# Patient Record
Sex: Female | Born: 2000 | Race: White | Hispanic: No | Marital: Single | Smoking: Never smoker
Health system: Southern US, Community
[De-identification: ages and names within clinical notes are randomized; demographics above are authoritative.]

## PROBLEM LIST (undated history)

## (undated) HISTORY — PX: ADENOIDECTOMY: SUR15

---
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
## 2018-08-28 ENCOUNTER — Other Ambulatory Visit: Payer: Self-pay | Admitting: Obstetrics and Gynecology

## 2018-08-29 ENCOUNTER — Telehealth: Payer: Self-pay

## 2018-08-29 DIAGNOSIS — Z3041 Encounter for surveillance of contraceptive pills: Secondary | ICD-10-CM

## 2018-08-29 MED ORDER — NORGESTIMATE-ETH ESTRADIOL 0.25-35 MG-MCG PO TABS: 1.0000 | ORAL_TABLET | Freq: Every day | ORAL | 0 refills | Status: DC

## 2018-08-29 NOTE — Telephone Encounter (Signed)
Pt has one refill of OCP left and wants it switched to a different pharmacy. Rx sent for one pack of her OCP with 0 refills to new pharmacy. Pt is scheduling annual appt while in office today.

## 2018-09-26 ENCOUNTER — Other Ambulatory Visit: Payer: Self-pay | Admitting: *Deleted

## 2018-09-26 DIAGNOSIS — Z3041 Encounter for surveillance of contraceptive pills: Secondary | ICD-10-CM

## 2018-09-26 MED ORDER — NORGESTIMATE-ETH ESTRADIOL 0.25-35 MG-MCG PO TABS: 1.0000 | ORAL_TABLET | Freq: Every day | ORAL | 0 refills | Status: DC

## 2018-09-26 NOTE — Telephone Encounter (Signed)
Pt called requesting 1 RF on OCP's.  She has an annual scheduled for 09/27/18.  [facility]

## 2018-09-27 ENCOUNTER — Encounter: Payer: Self-pay | Admitting: Family Medicine

## 2018-09-27 ENCOUNTER — Ambulatory Visit: Payer: Self-pay | Admitting: Family Medicine

## 2018-09-27 VITALS — BP 111/78 | HR 98 | Ht 64.0 in | Wt 153.0 lb

## 2018-09-27 DIAGNOSIS — Z113 Encounter for screening for infections with a predominantly sexual mode of transmission: Secondary | ICD-10-CM

## 2018-09-27 DIAGNOSIS — Z01419 Encounter for gynecological examination (general) (routine) without abnormal findings: Secondary | ICD-10-CM

## 2018-09-27 DIAGNOSIS — Z3041 Encounter for surveillance of contraceptive pills: Secondary | ICD-10-CM

## 2018-09-27 MED ORDER — NORGESTIMATE-ETH ESTRADIOL 0.25-35 MG-MCG PO TABS
1.0000 | ORAL_TABLET | Freq: Every day | ORAL | 0 refills | Status: DC
Start: 2018-09-27 — End: 2019-07-30

## 2018-09-27 NOTE — Patient Instructions (Signed)
Preventive Care 18-39 Years, Female Preventive care refers to lifestyle choices and visits with your health care provider that can promote health and wellness. What does preventive care include?  A yearly physical exam. This is also called an annual well check.  Dental exams once or twice a year.  Routine eye exams. Ask your health care provider how often you should have your eyes checked.  Personal lifestyle choices, including: ? Daily care of your teeth and gums. ? Regular physical activity. ? Eating a healthy diet. ? Avoiding tobacco and drug use. ? Limiting alcohol use. ? Practicing safe sex. ? Taking vitamin and mineral supplements as recommended by your health care provider. What happens during an annual well check? The services and screenings done by your health care provider during your annual well check will depend on your age, overall health, lifestyle risk factors, and family history of disease. Counseling Your health care provider may ask you questions about your:  Alcohol use.  Tobacco use.  Drug use.  Emotional well-being.  Home and relationship well-being.  Sexual activity.  Eating habits.  Work and work Statistician.  Method of birth control.  Menstrual cycle.  Pregnancy history.  Screening You may have the following tests or measurements:  Height, weight, and BMI.  Diabetes screening. This is done by checking your blood sugar (glucose) after you have not eaten for a while (fasting).  Blood pressure.  Lipid and cholesterol levels. These may be checked every 5 years starting at age 19.  Skin check.  Hepatitis C blood test.  Hepatitis B blood test.  Sexually transmitted disease (STD) testing.  BRCA-related cancer screening. This may be done if you have a family history of breast, ovarian, tubal, or peritoneal cancers.  Pelvic exam and Pap test. This may be done every 3 years starting at age 19. Starting at age 30, this may be done  every 5 years if you have a Pap test in combination with an HPV test.  Discuss your test results, treatment options, and if necessary, the need for more tests with your health care provider. Vaccines Your health care provider may recommend certain vaccines, such as:  Influenza vaccine. This is recommended every year.  Tetanus, diphtheria, and acellular pertussis (Tdap, Td) vaccine. You may need a Td booster every 10 years.  Varicella vaccine. You may need this if you have not been vaccinated.  HPV vaccine. If you are 19 or younger, you may need three doses over 6 months.  Measles, mumps, and rubella (MMR) vaccine. You may need at least one dose of MMR. You may also need a second dose.  Pneumococcal 13-valent conjugate (PCV13) vaccine. You may need this if you have certain conditions and were not previously vaccinated.  Pneumococcal polysaccharide (PPSV23) vaccine. You may need one or two doses if you smoke cigarettes or if you have certain conditions.  Meningococcal vaccine. One dose is recommended if you are age 68-19 years and a first-year college student living in a residence hall, or if you have one of several medical conditions. You may also need additional booster doses.  Hepatitis A vaccine. You may need this if you have certain conditions or if you travel or work in places where you may be exposed to hepatitis A.  Hepatitis B vaccine. You may need this if you have certain conditions or if you travel or work in places where you may be exposed to hepatitis B.  Haemophilus influenzae type b (Hib) vaccine. You may need this  if you have certain risk factors.  Talk to your health care provider about which screenings and vaccines you need and how often you need them. This information is not intended to replace advice given to you by your health care provider. Make sure you discuss any questions you have with your health care provider. Document Released: 12/20/2001 Document Revised:  07/13/2016 Document Reviewed: 08/25/2015 Elsevier Interactive Patient Education  2018 Elsevier Inc.  

## 2018-09-27 NOTE — Progress Notes (Signed)
  Subjective:     Melissa Dorsey is a 17 y.o. female and is here for a comprehensive physical exam. The patient reports problems - mood changes. Unclear if this is related to her current OC regimen.   The following portions of the patient's history were reviewed and updated as appropriate: allergies, current medications, past family history, past medical history, past social history, past surgical history and problem list.  Review of Systems Pertinent items noted in HPI and remainder of comprehensive ROS otherwise negative.   Objective:    BP 111/78   Pulse 98   Ht 5\' 4"  (1.626 m)   Wt 153 lb (69.4 kg)   LMP 09/25/2018   BMI 26.26 kg/m  General appearance: alert, cooperative and appears stated age Head: Normocephalic, without obvious abnormality, atraumatic Neck: no adenopathy, supple, symmetrical, trachea midline and thyroid not enlarged, symmetric, no tenderness/mass/nodules Back: symmetric, no curvature. ROM normal. No CVA tenderness. Lungs: clear to auscultation bilaterally Breasts: normal appearance, no masses or tenderness Heart: regular rate and rhythm, S1, S2 normal, no murmur, click, rub or gallop Abdomen: soft, non-tender; bowel sounds normal; no masses,  no organomegaly Pelvic: cervix normal in appearance, external genitalia normal, no adnexal masses or tenderness, no cervical motion tenderness, uterus normal size, shape, and consistency and vagina normal without discharge Extremities: extremities normal, atraumatic, no cyanosis or edema Pulses: 2+ and symmetric Skin: Skin color, texture, turgor normal. No rashes or lesions or mulitple irregularly shaped moles on back and chest noted Lymph nodes: Cervical, supraclavicular, and axillary nodes normal. Neurologic: Grossly normal    Assessment:    Healthy female exam.      Plan:     Encounter for gynecological examination without abnormal finding  Encounter for surveillance of contraceptive pills - refilled OC's--may  skip placebos if mood swiings are related to this. Also, consider IUD as alternative. - Plan: norgestimate-ethinyl estradiol (ORTHO-CYCLEN,SPRINTEC,PREVIFEM) 0.25-35 MG-MCG tablet  Screen for STD (sexually transmitted disease) - Plan: Cervicovaginal ancillary only, HIV Antibody (routine testing w rflx), Hepatitis C antibody, Hepatitis B surface antigen, RPR   See After Visit Summary for Counseling Recommendations

## 2018-09-28 LAB — CERVICOVAGINAL ANCILLARY ONLY
Chlamydia: NEGATIVE
Neisseria Gonorrhea: NEGATIVE

## 2018-09-28 LAB — HEPATITIS C ANTIBODY
HEP C AB: NONREACTIVE
SIGNAL TO CUT-OFF: 0.02 (ref <1.00)

## 2018-09-28 LAB — HEPATITIS B SURFACE ANTIGEN: HEP B S AG: NONREACTIVE

## 2018-09-28 LAB — HIV ANTIBODY (ROUTINE TESTING W REFLEX): HIV 1&2 Ab, 4th Generation: NONREACTIVE

## 2018-09-28 LAB — RPR: RPR: NONREACTIVE

## 2019-07-29 ENCOUNTER — Other Ambulatory Visit: Payer: Self-pay | Admitting: Obstetrics and Gynecology

## 2019-07-29 DIAGNOSIS — Z3041 Encounter for surveillance of contraceptive pills: Secondary | ICD-10-CM

## 2019-07-30 ENCOUNTER — Telehealth: Payer: Self-pay

## 2019-07-30 DIAGNOSIS — Z3041 Encounter for surveillance of contraceptive pills: Secondary | ICD-10-CM

## 2019-07-30 MED ORDER — NORGESTIMATE-ETH ESTRADIOL 0.25-35 MG-MCG PO TABS: 1.0000 | ORAL_TABLET | Freq: Every day | ORAL | 3 refills | Status: DC

## 2019-07-30 NOTE — Telephone Encounter (Signed)
PT needs refill of OCP until her annual in November. Refill sent.

## 2019-08-12 ENCOUNTER — Emergency Department (INDEPENDENT_AMBULATORY_CARE_PROVIDER_SITE_OTHER)
Admission: EM | Admit: 2019-08-12 | Discharge: 2019-08-12 | Disposition: A | Discharge status: Home / Self Care | Payer: Self-pay | Source: Home / Self Care | Attending: Family Medicine | Admitting: Family Medicine

## 2019-08-12 ENCOUNTER — Emergency Department (INDEPENDENT_AMBULATORY_CARE_PROVIDER_SITE_OTHER): Payer: Self-pay

## 2019-08-12 ENCOUNTER — Other Ambulatory Visit: Payer: Self-pay

## 2019-08-12 DIAGNOSIS — M940 Chondrocostal junction syndrome [Tietze]: Secondary | ICD-10-CM

## 2019-08-12 DIAGNOSIS — R0781 Pleurodynia: Secondary | ICD-10-CM

## 2019-08-12 LAB — POCT URINALYSIS DIP (MANUAL ENTRY)
Bilirubin, UA: NEGATIVE
Glucose, UA: NEGATIVE mg/dL
Ketones, POC UA: NEGATIVE mg/dL
Nitrite, UA: NEGATIVE
Protein Ur, POC: NEGATIVE mg/dL
Spec Grav, UA: 1.025 (ref 1.010–1.025)
Urobilinogen, UA: 0.2 E.U./dL
pH, UA: 6 (ref 5.0–8.0)

## 2019-08-12 MED ORDER — PREDNISONE 20 MG PO TABS: ORAL_TABLET | ORAL | 0 refills | Status: DC

## 2019-08-12 NOTE — ED Triage Notes (Signed)
Pt c/o RT sided rib pain that radiates down x 3 days. Pain worsens with breathing/coughing. Pain 8/10. Taking ibuprofen prn.

## 2019-08-12 NOTE — Discharge Instructions (Addendum)
Apply ice pack for 20 to 30 minutes, 3 to 4 times daily  Continue until pain and swelling decrease.  After finishing prednisone, may switch to Ibuprofen 200mg , 4 tabs every 8 hours with food.

## 2019-08-12 NOTE — ED Provider Notes (Signed)
[facility]    CSN: [id number] Arrival date & time: 08/12/19  1259      History   Chief Complaint Chief Complaint  Patient presents with  . Flank Pain    RT    HPI Melissa Dorsey is a 18 y.o. female.   Patient complains of 3 day history of right side rib pain that radiates downward.  The pain is worse with inspiration/coughing although she denies cough or recent URI.   She denies fevers, chills, and sweats, and no urinary symptoms.  The history is provided by the patient.  Chest Pain Pain location:  R chest Pain quality: sharp   Radiates to: right upper abdomen. Pain severity:  Moderate Onset quality:  Sudden Duration:  3 days Timing:  Intermittent Progression:  Unchanged Chronicity:  New Context: breathing   Relieved by:  Nothing Worsened by:  Coughing Ineffective treatments: Ibuprofen. Associated symptoms: no abdominal pain, no back pain, no cough, no diaphoresis, no dysphagia, no fatigue, no fever, no nausea and no shortness of breath     History reviewed. No pertinent past medical history.  There are no active problems to display for this patient.   Past Surgical History:  Procedure Laterality Date  . ADENOIDECTOMY      OB History    Gravida  0   Para  0   Term  0   Preterm  0   AB  0   Living  0     SAB  0   TAB  0   Ectopic  0   Multiple  0   Live Births  0            Home Medications    Prior to Admission medications   Medication Sig Start Date End Date Taking? Authorizing Provider  norgestimate-ethinyl estradiol (ORTHO-CYCLEN) 0.25-35 MG-MCG tablet Take 1 tablet by mouth daily. 07/30/19   Emily Filbert, MD  predniSONE (DELTASONE) 20 MG tablet Take one tab by mouth twice daily for 4 days, then one daily. Take with food. 08/12/19   Kandra Nicolas, MD    Family History Family History  Problem Relation Age of Onset  . Heart attack Paternal Grandfather   . Hypertension Father     Social History Social History    Tobacco Use  . Smoking status: Never Smoker  . Smokeless tobacco: Never Used  Substance Use Topics  . Alcohol use: No  . Drug use: No     Allergies   Patient has no known allergies.   Review of Systems Review of Systems  Constitutional: Negative for diaphoresis, fatigue and fever.  HENT: Negative for trouble swallowing.   Respiratory: Negative for cough and shortness of breath.   Cardiovascular: Positive for chest pain.  Gastrointestinal: Negative for abdominal pain and nausea.  Genitourinary: Negative.   Musculoskeletal: Negative for back pain.  All other systems reviewed and are negative.    Physical Exam Triage Vital Signs ED Triage Vitals  Enc Vitals Group     BP 08/12/19 1335 119/76     Pulse Rate 08/12/19 1335 88     Resp 08/12/19 1335 18     Temp 08/12/19 1335 97.9 F (36.6 C)     Temp Source 08/12/19 1335 Oral     SpO2 08/12/19 1335 99 %     Weight 08/12/19 1336 179 lb (81.2 kg)     Height 08/12/19 1336 5\' 4"  (1.626 m)     Head Circumference --  Peak Flow --      Pain Score 08/12/19 1336 8     Pain Loc --      Pain Edu? --      Excl. in GC? --    No data found.  Updated Vital Signs BP 119/76 (BP Location: Right Arm)   Pulse 88   Temp 97.9 F (36.6 C) (Oral)   Resp 18   Ht 5\' 4"  (1.626 m)   Wt 81.2 kg   LMP 07/29/2019 (Approximate)   SpO2 99%   BMI 30.73 kg/m   Visual Acuity Right Eye Distance:   Left Eye Distance:   Bilateral Distance:    Right Eye Near:   Left Eye Near:    Bilateral Near:     Physical Exam Vitals signs and nursing note reviewed.  Constitutional:      General: She is not in acute distress. HENT:     Head: Normocephalic.     Nose: Nose normal.     Mouth/Throat:     Pharynx: Oropharynx is clear.  Eyes:     Pupils: Pupils are equal, round, and reactive to light.  Cardiovascular:     Heart sounds: Normal heart sounds.  Pulmonary:     Breath sounds: Normal breath sounds.  Chest:       Comments: There is  distinct tenderness to palpation right lower anterior chest as noted on diagram.  Abdominal:     Tenderness: There is no abdominal tenderness.  Musculoskeletal:     Right lower leg: No edema.     Left lower leg: No edema.  Lymphadenopathy:     Cervical: No cervical adenopathy.  Skin:    General: Skin is warm and dry.     Findings: No rash.  Neurological:     Mental Status: She is alert.      UC Treatments / Results  Labs (all labs ordered are listed, but only abnormal results are displayed) Labs Reviewed  POCT URINALYSIS DIP (MANUAL ENTRY) - Abnormal; Notable for the following components:      Result Value   Blood, UA small (*)    Leukocytes, UA Small (1+) (*)    All other components within normal limits  URINE CULTURE    EKG   Radiology Dg Ribs Unilateral W/chest Right  Result Date: 08/12/2019 CLINICAL DATA:  Right anterior lower rib pain for several days. No known injury. EXAM: RIGHT RIBS AND CHEST - 3+ VIEW COMPARISON:  None. FINDINGS: The heart size and mediastinal contours are normal. The lungs are clear. There is no pleural effusion or pneumothorax. A metallic BB was placed over the right inferior costal margin at the area of pain. No underlying rib fracture or focal rib abnormality identified. IMPRESSION: No evidence of rib fracture, focal rib lesion or active cardiopulmonary process. Electronically Signed   By: Carey BullocksWilliam  Veazey M.D.   On: 08/12/2019 14:38    Procedures Procedures (including critical care time)  Medications Ordered in UC Medications - No data to display  Initial Impression / Assessment and Plan / UC Course  I have reviewed the triage vital signs and the nursing notes.  Pertinent labs & imaging results that were available during my care of the patient were reviewed by me and considered in my medical decision making (see chart for details).    As a precaution will check urine culture. Begin prednisone burst/taper. Followup with Dr. Rodney Langtonhomas  Thekkekandam or Dr. Clementeen GrahamEvan [facility] ([facility]) if not improving about two weeks.  Final Clinical Impressions(s) / UC Diagnoses   Final diagnoses:  Costochondritis     Discharge Instructions     Apply ice pack for 20 to 30 minutes, 3 to 4 times daily  Continue until pain and swelling decrease.  After finishing prednisone, may switch to Ibuprofen 200mg , 4 tabs every 8 hours with food.     ED Prescriptions    Medication Sig Dispense Auth. Provider   predniSONE (DELTASONE) 20 MG tablet Take one tab by mouth twice daily for 4 days, then one daily. Take with food. 11 tablet , MD        Lattie Haw, MD 08/16/19 [phone]

## 2019-08-14 ENCOUNTER — Telehealth: Payer: Self-pay | Admitting: Family Medicine

## 2019-08-14 ENCOUNTER — Telehealth: Payer: Self-pay | Admitting: *Deleted

## 2019-08-14 LAB — URINE CULTURE
MICRO NUMBER:: 955426
SPECIMEN QUALITY:: ADEQUATE

## 2019-08-14 MED ORDER — CIPROFLOXACIN HCL 500 MG PO TABS: 500.0000 mg | ORAL_TABLET | Freq: Two times a day (BID) | ORAL | 0 refills | Status: AC

## 2019-08-14 NOTE — Telephone Encounter (Signed)
Urine culture shows e. Coli, sensitive to multiple antibiotics. A.  UTI P.  Begin Cipro 500mg  BID for one week

## 2019-08-14 NOTE — Telephone Encounter (Signed)
LM to call back to inform pt of Dr Aurelio Brash phone note.

## 2019-08-14 NOTE — Telephone Encounter (Signed)
Spoke to pt given Ucx results. Charna Archer, LPN

## 2019-10-02 ENCOUNTER — Ambulatory Visit: Payer: Self-pay | Admitting: Obstetrics and Gynecology

## 2019-10-08 ENCOUNTER — Ambulatory Visit (INDEPENDENT_AMBULATORY_CARE_PROVIDER_SITE_OTHER): Payer: Self-pay | Admitting: Advanced Practice Midwife

## 2019-10-08 ENCOUNTER — Other Ambulatory Visit: Payer: Self-pay

## 2019-10-08 ENCOUNTER — Encounter: Payer: Self-pay | Admitting: Advanced Practice Midwife

## 2019-10-08 VITALS — BP 135/84 | HR 105 | Ht 64.0 in | Wt 177.0 lb

## 2019-10-08 DIAGNOSIS — A630 Anogenital (venereal) warts: Secondary | ICD-10-CM

## 2019-10-08 DIAGNOSIS — Z3041 Encounter for surveillance of contraceptive pills: Secondary | ICD-10-CM

## 2019-10-08 DIAGNOSIS — Z01419 Encounter for gynecological examination (general) (routine) without abnormal findings: Secondary | ICD-10-CM

## 2019-10-08 DIAGNOSIS — Z3009 Encounter for other general counseling and advice on contraception: Secondary | ICD-10-CM

## 2019-10-08 MED ORDER — NORGESTIMATE-ETH ESTRADIOL 0.25-35 MG-MCG PO TABS: 1.0000 | ORAL_TABLET | Freq: Every day | ORAL | 11 refills | Status: DC

## 2019-10-08 MED ORDER — IMIQUIMOD 5 % EX CREA: TOPICAL_CREAM | CUTANEOUS | 1 refills | Status: DC

## 2019-10-08 NOTE — Progress Notes (Signed)
GYNECOLOGY ANNUAL PREVENTATIVE CARE ENCOUNTER NOTE  History:     Melissa Dorsey is a 18 y.o. G0P0000 female here for a routine annual gynecologic exam and birth control refill. Current complaints: patient complains of a few small bumps on vagina for about 3 weeks. She denies pain. Reports that the bumps sometimes itch and bleed if she wipes too hard after using the bathroom. Denies abnormal vaginal bleeding, discharge, pelvic pain, problems with intercourse or other gynecologic concerns.    Gynecologic History Patient's last menstrual period was 09/24/2019. Contraception: OCP (estrogen/progesterone) Last Pap: N/A.  Last mammogram: N/A  Obstetric History OB History  Gravida Para Term Preterm AB Living  0 0 0 0 0 0  SAB TAB Ectopic Multiple Live Births  0 0 0 0 0    History reviewed. No pertinent past medical history.  Past Surgical History:  Procedure Laterality Date  . ADENOIDECTOMY      Current Outpatient Medications on File Prior to Visit  Medication Sig Dispense Refill  . norgestimate-ethinyl estradiol (ORTHO-CYCLEN) 0.25-35 MG-MCG tablet Take 1 tablet by mouth daily. 1 Package 3  . predniSONE (DELTASONE) 20 MG tablet Take one tab by mouth twice daily for 4 days, then one daily. Take with food. (Patient not taking: Reported on 10/08/2019) 11 tablet 0   No current facility-administered medications on file prior to visit.     No Known Allergies  Social History:  reports that she has never smoked. She has never used smokeless tobacco. She reports that she does not drink alcohol or use drugs.  Family History  Problem Relation Age of Onset  . Heart attack Paternal Grandfather   . Hypertension Father     The following portions of the patient's history were reviewed and updated as appropriate: allergies, current medications, past family history, past medical history, past social history, past surgical history and problem list.  Review of Systems Pertinent items noted in  HPI and remainder of comprehensive ROS otherwise negative.  Physical Exam:  BP 135/84   Pulse (!) 105   Ht 5\' 4"  (1.626 m)   Wt 80.3 kg   LMP 09/24/2019   BMI 30.38 kg/m  CONSTITUTIONAL: Well-developed, well-nourished female in no acute distress.  HENT:  Normocephalic, atraumatic, External right and left ear normal. Oropharynx is clear and moist EYES: Conjunctivae and EOM are normal. Pupils are equal, round, and reactive to light. No scleral icterus.  NECK: Normal range of motion, supple, no masses.  Normal thyroid.  SKIN: Skin is warm and dry. No rash noted. Not diaphoretic. No erythema. No pallor. MUSCULOSKELETAL: Normal range of motion. No tenderness.  No cyanosis, clubbing, or edema.  2+ distal pulses. NEUROLOGIC: Alert and oriented to person, place, and time. Normal reflexes, muscle tone coordination. No cranial nerve deficit noted. PSYCHIATRIC: Normal mood and affect. Normal behavior. Normal judgment and thought content. CARDIOVASCULAR: Normal heart rate, regular rhythm RESPIRATORY: Clear to auscultation bilaterally. Effort and breath sounds normal, no problems with respiration noted. BREASTS: Symmetric in size. No masses, skin changes, nipple drainage, or lymphadenopathy. ABDOMEN: Soft, normal bowel sounds, no distention noted.  No tenderness, rebound or guarding.  PELVIC: Normal appearing external genitalia; Various small, pink, irregular shaped bumps noted along labia with 2 large pink, irregular shaped bumps near perineum, c/w genital warts. TCA applied.    Assessment and Plan:  1. Genital warts - Pt c/o itchy bumps that bleed with wiping - Bumps consistent with genital warts - TCA applied today in the office  -  Rx for Aldara cream sent to pharmacy  - imiquimod (ALDARA) 5 % cream; Apply topically 3 (three) times a week.  Dispense: 12 each; Refill: 1  2. Well woman exam - Normal well woman exam - Follow up in 1 year for well woman exam with Pap   3. Encounter for general  counseling and advice on contraceptive management - Contraceptive management - Pt currently on Sprintec. No issues. Would like to continue current birth control method - Rx for 1 year supply given  - norgestimate-ethinyl estradiol (ORTHO-CYCLEN) 0.25-35 MG-MCG tablet; Take 1 tablet by mouth daily.  Dispense: 1 Package; Refill: 11  4. Encounter for surveillance of contraceptive pills - Birth control refilled   Follow up in 1 year for well woman exam and pap smear  Routine preventative health maintenance measures emphasized. Please refer to After Visit Summary for other counseling recommendations.    Maryagnes Amos, SNM 1:20 PM

## 2019-10-08 NOTE — Patient Instructions (Signed)
Genital Warts Genital warts are a common STD (sexually transmitted disease). They may appear as small bumps on the skin of the genital and anal areas. They sometimes become irritated and cause pain. Genital warts are easily passed to other people through sexual contact. Many people do not know that they are infected. They may be infected for years without symptoms. However, even if they do not have symptoms, they can pass the infection to their sexual partners. Getting treatment is important because genital warts can lead to other problems. In females, the virus that causes genital warts may increase the risk for cervical cancer. What are the causes? This condition is caused by a virus that is called human papillomavirus (HPV). HPV is spread by having unprotected sex with an infected person. It can be spread through vaginal, anal, and oral sex. What increases the risk? You are more likely to develop this condition if:  You have unprotected sex.  You have multiple sexual partners.  You are sexually active before age 16.  You are a man who isnot circumcised.  You have a female sexual partner who is not circumcised.  You have a weakened body defense system (immune system) due to disease or medicine.  You smoke. What are the signs or symptoms? Symptoms of this condition include:  Small growths in the genital area or anal area. These warts often grow in clusters.  Itching and irritation in the genital area or anal area.  Bleeding from the warts.  Pain during sex. How is this diagnosed? This condition is diagnosed based on:  Your symptoms.  A physical exam. You may also have other tests, including:  Biopsy. A tissue sample is removed so it can be checked under a microscope.  Colposcopy. In females, a magnifying tool is used to examine the vagina and cervix. Certain solutions may be used to make the HPV cells change color so they can be seen more easily.  A Pap test in females.   Tests for other STDs. How is this treated? This condition may be treated with:  Medicines, such as: ? Solutions or creams applied to your skin (topical).  Procedures, such as: ? Freezing the warts with liquid nitrogen (cryotherapy). ? Burning the warts with a laser or electric probe (electrocautery). ? Surgery to remove the warts. Follow these instructions at home: Medicines   Apply over-the-counter and prescription medicines only as told by your health care provider.  Do not treat genital warts with medicines that are used for treating hand warts.  Talk with your health care provider about using over-the-counter anti-itch creams. Instructions for women  Get screened regularly for cervical cancer. Women who have genital warts are at an increased risk for this cancer. This type of cancer is slow growing and can be cured if it is found early.  If you become pregnant, tell your health care provider that you have had an HPV infection. Your health care provider will monitor you closely during pregnancy to be sure that your baby is safe. General instructions  Do not touch or scratch the warts.  Do not have sex until your treatment has been completed.  Tell your current and past sexual partners about your condition because they may also need treatment.  After treatment, use condoms during sex to prevent future infections.  Keep all follow-up visits as told by your health care provider. This is important. How is this prevented? Talk with your health care provider about getting the HPV vaccine. The vaccine:    Can prevent some HPV infections and cancers.  Is recommended for males and females who are 11-26 years old.  Is not recommended for pregnant women.  Will not work if you already have HPV. Contact a health care provider if you:  Have redness, swelling, or pain in the area of the treated skin.  Have a fever.  Feel generally ill.  Feel lumps in and around your genital or  anal area.  Have bleeding in your genital or anal area.  Have pain during sex. Summary  Genital warts are a common STD (sexually transmitted disease). It may appear as small bumps on the genital and anal areas.  This condition is caused by a virus that is called human papillomavirus (HPV). HPV is spread by having unprotected sex with an infected person. It can be spread through vaginal, anal, and oral sex.  Treatment is important because genital warts can lead to other problems. In females, the virus that causes genital warts may increase the risk for cervical cancer.  This condition may be treated with medicine that is applied to the skin, or procedures to remove the warts.  The HPV vaccine can prevent some HPV infections and cancers. It is recommended that the vaccine be given to males and females who are 11-26 years old. This information is not intended to replace advice given to you by your health care provider. Make sure you discuss any questions you have with your health care provider. Document Released: 10/21/2000 Document Revised: 11/28/2017 Document Reviewed: 11/28/2017 Elsevier Patient Education  2020 [employer] Inc.  

## 2019-10-08 NOTE — Progress Notes (Signed)
No complaints Needs refill of OCP

## 2019-11-05 ENCOUNTER — Ambulatory Visit: Payer: Self-pay | Admitting: Advanced Practice Midwife

## 2019-11-18 ENCOUNTER — Ambulatory Visit: Payer: Self-pay | Admitting: Obstetrics & Gynecology

## 2019-12-02 ENCOUNTER — Encounter: Payer: Self-pay | Admitting: Obstetrics & Gynecology

## 2019-12-02 ENCOUNTER — Ambulatory Visit (INDEPENDENT_AMBULATORY_CARE_PROVIDER_SITE_OTHER): Payer: Self-pay | Admitting: Obstetrics & Gynecology

## 2019-12-02 ENCOUNTER — Other Ambulatory Visit: Payer: Self-pay

## 2019-12-02 VITALS — BP 122/79 | HR 89 | Resp 16 | Ht 64.0 in | Wt 178.0 lb

## 2019-12-02 DIAGNOSIS — A630 Anogenital (venereal) warts: Secondary | ICD-10-CM

## 2019-12-02 NOTE — Patient Instructions (Signed)
Genital Warts Genital warts are a common STD (sexually transmitted disease). They may appear as small bumps on the skin of the genital and anal areas. They sometimes become irritated and cause pain. Genital warts are easily passed to other people through sexual contact. Many people do not know that they are infected. They may be infected for years without symptoms. However, even if they do not have symptoms, they can pass the infection to their sexual partners. Getting treatment is important because genital warts can lead to other problems. In females, the virus that causes genital warts may increase the risk for cervical cancer. What are the causes? This condition is caused by a virus that is called human papillomavirus (HPV). HPV is spread by having unprotected sex with an infected person. It can be spread through vaginal, anal, and oral sex. What increases the risk? You are more likely to develop this condition if:  You have unprotected sex.  You have multiple sexual partners.  You are sexually active before age 16.  You are a man who isnot circumcised.  You have a female sexual partner who is not circumcised.  You have a weakened body defense system (immune system) due to disease or medicine.  You smoke. What are the signs or symptoms? Symptoms of this condition include:  Small growths in the genital area or anal area. These warts often grow in clusters.  Itching and irritation in the genital area or anal area.  Bleeding from the warts.  Pain during sex. How is this diagnosed? This condition is diagnosed based on:  Your symptoms.  A physical exam. You may also have other tests, including:  Biopsy. A tissue sample is removed so it can be checked under a microscope.  Colposcopy. In females, a magnifying tool is used to examine the vagina and cervix. Certain solutions may be used to make the HPV cells change color so they can be seen more easily.  A Pap test in  females.  Tests for other STDs. How is this treated? This condition may be treated with:  Medicines, such as: ? Solutions or creams applied to your skin (topical).  Procedures, such as: ? Freezing the warts with liquid nitrogen (cryotherapy). ? Burning the warts with a laser or electric probe (electrocautery). ? Surgery to remove the warts. Follow these instructions at home: Medicines   Apply over-the-counter and prescription medicines only as told by your health care provider.  Do not treat genital warts with medicines that are used for treating hand warts.  Talk with your health care provider about using over-the-counter anti-itch creams. Instructions for women  Get screened regularly for cervical cancer. Women who have genital warts are at an increased risk for this cancer. This type of cancer is slow growing and can be cured if it is found early.  If you become pregnant, tell your health care provider that you have had an HPV infection. Your health care provider will monitor you closely during pregnancy to be sure that your baby is safe. General instructions  Do not touch or scratch the warts.  Do not have sex until your treatment has been completed.  Tell your current and past sexual partners about your condition because they may also need treatment.  After treatment, use condoms during sex to prevent future infections.  Keep all follow-up visits as told by your health care provider. This is important. How is this prevented? Talk with your health care provider about getting the HPV vaccine. The vaccine:    Can prevent some HPV infections and cancers.  Is recommended for males and females who are 11-26 years old.  Is not recommended for pregnant women.  Will not work if you already have HPV. Contact a health care provider if you:  Have redness, swelling, or pain in the area of the treated skin.  Have a fever.  Feel generally ill.  Feel lumps in and around  your genital or anal area.  Have bleeding in your genital or anal area.  Have pain during sex. Summary  Genital warts are a common STD (sexually transmitted disease). It may appear as small bumps on the genital and anal areas.  This condition is caused by a virus that is called human papillomavirus (HPV). HPV is spread by having unprotected sex with an infected person. It can be spread through vaginal, anal, and oral sex.  Treatment is important because genital warts can lead to other problems. In females, the virus that causes genital warts may increase the risk for cervical cancer.  This condition may be treated with medicine that is applied to the skin, or procedures to remove the warts.  The HPV vaccine can prevent some HPV infections and cancers. It is recommended that the vaccine be given to males and females who are 11-26 years old. This information is not intended to replace advice given to you by your health care provider. Make sure you discuss any questions you have with your health care provider. Document Revised: 11/28/2017 Document Reviewed: 11/28/2017 Elsevier Patient Education  2020 Elsevier Inc.  

## 2019-12-02 NOTE — Progress Notes (Signed)
   Subjective:    Patient ID: Melissa Dorsey, female    DOB: 2001-04-08, 18 y.o.   MRN: [id number]  HPI  Pt here for f/u of vulvar warts.  Pt had TXA then Aldara.  Most of warts resolved.  She would like them all gone if possible.  She did have erythema from the Aldara applications.     Review of Systems  Respiratory: Negative.   Cardiovascular: Negative.   Gastrointestinal: Negative.   Genitourinary: Negative.        Objective:   Physical Exam Vitals reviewed.  Constitutional:      General: She is not in acute distress.    Appearance: Normal appearance. She is well-developed.  HENT:     Head: Normocephalic and atraumatic.  Eyes:     Conjunctiva/sclera: Conjunctivae normal.  Cardiovascular:     Rate and Rhythm: Normal rate.  Pulmonary:     Effort: Pulmonary effort is normal.  Genitourinary:   Skin:    General: Skin is warm and dry.  Neurological:     Mental Status: She is alert and oriented to person, place, and time.       Assessment & Plan:  19 yo female with a few remaining warts at introitus.  1.  TCA applied 2. Pt to look / self examine in 3 weeks to see if resolved.  She will contact [place] if she in not able to assess or has warts still present.

## 2020-02-11 ENCOUNTER — Other Ambulatory Visit (HOSPITAL_COMMUNITY)
Admission: RE | Admit: 2020-02-11 | Discharge: 2020-02-11 | Disposition: A | Discharge status: Home / Self Care | Payer: 59 | Source: Ambulatory Visit | Attending: Advanced Practice Midwife | Admitting: Advanced Practice Midwife

## 2020-02-11 ENCOUNTER — Encounter: Payer: Self-pay | Admitting: Advanced Practice Midwife

## 2020-02-11 ENCOUNTER — Other Ambulatory Visit: Payer: Self-pay

## 2020-02-11 ENCOUNTER — Ambulatory Visit (INDEPENDENT_AMBULATORY_CARE_PROVIDER_SITE_OTHER): Payer: 59 | Admitting: Advanced Practice Midwife

## 2020-02-11 VITALS — BP 118/77 | HR 86 | Temp 98.5°F | Resp 16 | Ht 64.0 in | Wt 178.0 lb

## 2020-02-11 DIAGNOSIS — Z1272 Encounter for screening for malignant neoplasm of vagina: Secondary | ICD-10-CM | POA: Diagnosis not present

## 2020-02-11 DIAGNOSIS — Z975 Presence of (intrauterine) contraceptive device: Secondary | ICD-10-CM | POA: Insufficient documentation

## 2020-02-11 DIAGNOSIS — Z30430 Encounter for insertion of intrauterine contraceptive device: Secondary | ICD-10-CM | POA: Diagnosis not present

## 2020-02-11 DIAGNOSIS — Z30432 Encounter for removal of intrauterine contraceptive device: Secondary | ICD-10-CM | POA: Diagnosis not present

## 2020-02-11 DIAGNOSIS — Z3202 Encounter for pregnancy test, result negative: Secondary | ICD-10-CM

## 2020-02-11 DIAGNOSIS — Z124 Encounter for screening for malignant neoplasm of cervix: Secondary | ICD-10-CM | POA: Diagnosis present

## 2020-02-11 DIAGNOSIS — Z01419 Encounter for gynecological examination (general) (routine) without abnormal findings: Secondary | ICD-10-CM

## 2020-02-11 DIAGNOSIS — T839XXA Unspecified complication of genitourinary prosthetic device, implant and graft, initial encounter: Secondary | ICD-10-CM

## 2020-02-11 LAB — POCT URINE PREGNANCY: Preg Test, Ur: NEGATIVE

## 2020-02-11 MED ORDER — LEVONORGESTREL 19.5 MCG/DAY IU IUD: INTRAUTERINE_SYSTEM | Freq: Once | INTRAUTERINE | Status: AC

## 2020-02-11 NOTE — Progress Notes (Addendum)
Subjective:     Teressa Mcglocklin is a 19 y.o. female here at [facility] for a routine exam and contraceptive visit.  Current complaints: Wants to change from OCPs to IUD for contraception.  Personal health questionnaire reviewed: yes.  Do you have a primary care provider? Yes  How many times per week do you exercise? 3-5  Do you feel safe at home? yes Has anyone hit, slapped, or kicked you recently? no Do you feel sad, tired, or upset most days or are you mostly happy with life? Mostly happy    Gynecologic History Patient's last menstrual period was 02/08/2020. Contraception: OCP (estrogen/progesterone) Last Pap: n/a.  Last mammogram: n/a.   Obstetric History OB History  Gravida Para Term Preterm AB Living  0 0 0 0 0 0  SAB TAB Ectopic Multiple Live Births  0 0 0 0 0     The following portions of the patient's history were reviewed and updated as appropriate: allergies, current medications, past family history, past medical history, past social history, past surgical history and problem list.  Review of Systems Pertinent items noted in HPI and remainder of comprehensive ROS otherwise negative.    Objective:   BP 118/77   Pulse 86   Temp 98.5 F (36.9 C)   Resp 16   Ht 5\' 4"  (1.626 m)   Wt 178 lb (80.7 kg)   LMP 02/08/2020   BMI 30.55 kg/m  VS reviewed, nursing note reviewed,  Constitutional: well developed, well nourished, no distress HEENT: normocephalic CV: normal rate Pulm/chest wall: normal effort Breast Exam: Deferred with shared decision making Abdomen: soft Neuro: alert and oriented x 3 Skin: warm, dry Psych: affect normal Pelvic exam: Cervix pink, visually closed, without lesion, scant white creamy discharge, vaginal walls and external genitalia normal Bimanual exam: Cervix 0/long/high, firm, anterior, neg CMT, uterus nontender, nonenlarged, adnexa without tenderness, enlargement, or mass  IUD Procedure Note Patient identified, informed consent  performed.  Discussed risks of irregular bleeding, cramping, infection, malpositioning or misplacement of the IUD outside the uterus which may require further procedures. Time out was performed.  Urine pregnancy test negative.  Speculum placed in the vagina.  Cervix visualized.  Cleaned with Betadine x 2.  Grasped anteriorly with a single tooth tenaculum.  Uterus sounded to 6 cm.  Some difficulty with sound/placement of device through internal os so plastic dilator used.  Liletta IUD placed per manufacturer's recommendations.  Strings trimmed to 3-4 cm. Tenaculum was removed, good hemostasis noted.  Patient tolerated procedure well.   Patient was given post-procedure instructions and the Liletta care card with expiration date.  Patient was also asked to check IUD strings periodically and follow up in 4-6 weeks for IUD check.  Lot # 04/09/2020 Exp: 03/2023  Assessment/Plan:   1. Encounter for IUD insertion --UPT negative - POCT urine pregnancy --Pt currently on OCPs, finished pack and is having bleeding currently.   --Pt does not need to start new pack since IUD is in place. --Pt tolerated well.  Some difficulty with insertion through internal os, 04/2023 to confirm correct IUD placement.   2. Screening for cervical cancer - Cytology - PAP( [place])  3. Complication of intrauterine device (IUD), unspecified complication, initial encounter (HCC) --IUD inserted today with some difficulty, [place] ordered to check placement to confirm it is at fundus. - US PELVIS TRANSVAGINAL NON-OB (TV ONLY); Future  4. Well woman exam with routine gynecological exam --Pt age 50, without Pap hx. No OB/Gyn concerns.  Follow up in: 4 weeks or as needed.   Fatima Blank, CNM 12:24 PM

## 2020-02-11 NOTE — Patient Instructions (Signed)

## 2020-02-11 NOTE — Progress Notes (Signed)
  S: 19 y.o. G0P0000 called the office to report severe abdominal and vaginal pain with today's IUD placement.  She denies bleeding, n/v, or fever/chills.  She took ibuprofen 800 mg without relief.  O:  BP 118/77   Pulse 86   Temp 98.5 F (36.9 C)   Resp 16   Ht 5\' 4"  (1.626 m)   Wt 178 lb (80.7 kg)   LMP 02/08/2020   BMI 30.55 kg/m   VS reviewed, nursing note reviewed,  Constitutional: well developed, well nourished, no distress HEENT: normocephalic CV: normal rate Pulm/chest wall: normal effort Breast Exam: deferred Abdomen: soft Neuro: alert and oriented x 3 Skin: warm, dry Psych: affect normal Pelvic exam: Cervix pink, visually closed, without lesion, scant white creamy discharge, Liletta string visualized, ~ 4 cm out of cervical os, vaginal walls and external genitalia normal  IUD Removal  Patient was in the dorsal lithotomy position, normal external genitalia was noted.  A speculum was placed in the patient's vagina, normal discharge was noted, no lesions. The primiparous cervix was visualized, no lesions, no abnormal discharge.  The strings of the IUD were grasped and pulled using Prew forceps. The IUD was removed in its entirety. Patient tolerated the procedure well.    Patient will use OCPs for contraception.  Routine preventative health maintenance measures emphasized.  A/P: IUD removal --Pt with significant pain post IUD insertion. Given difficult placement, concerns that IUD is malpositioned. Outpatient 04/09/2020 is scheduled for 02/12/20, but discussed with pt and plan to remove IUD today. --IUD removed without difficulty and pt reports improved pain after IUD is out. --Discussed options, including continuing OCPs, and attempting IUD insertion again, possibly with MD and/or with Cytotec preprocedure. Pt prefers to keep her 4 week f/u visit to consider replacing IUD.   --Pt to seek care for n/v, fever, severe abdominal/pelvic pain, or heavy bleeding.  04/13/20,  CNM 2:42 PM

## 2020-02-12 ENCOUNTER — Other Ambulatory Visit: Payer: 59

## 2020-02-12 LAB — CYTOLOGY - PAP: Diagnosis: NEGATIVE

## 2020-03-10 ENCOUNTER — Ambulatory Visit: Payer: 59 | Admitting: Certified Nurse Midwife

## 2020-10-13 ENCOUNTER — Ambulatory Visit: Payer: Self-pay | Admitting: Advanced Practice Midwife

## 2020-12-02 IMAGING — DX DG RIBS W/ CHEST 3+V*R*
3 series · 3 of 3 positions shown · non-contrast
Comparison: None.

CLINICAL DATA: Right anterior lower rib pain for several days. No
known injury.

EXAM:
RIGHT RIBS AND CHEST - 3+ VIEW

[chest pa]
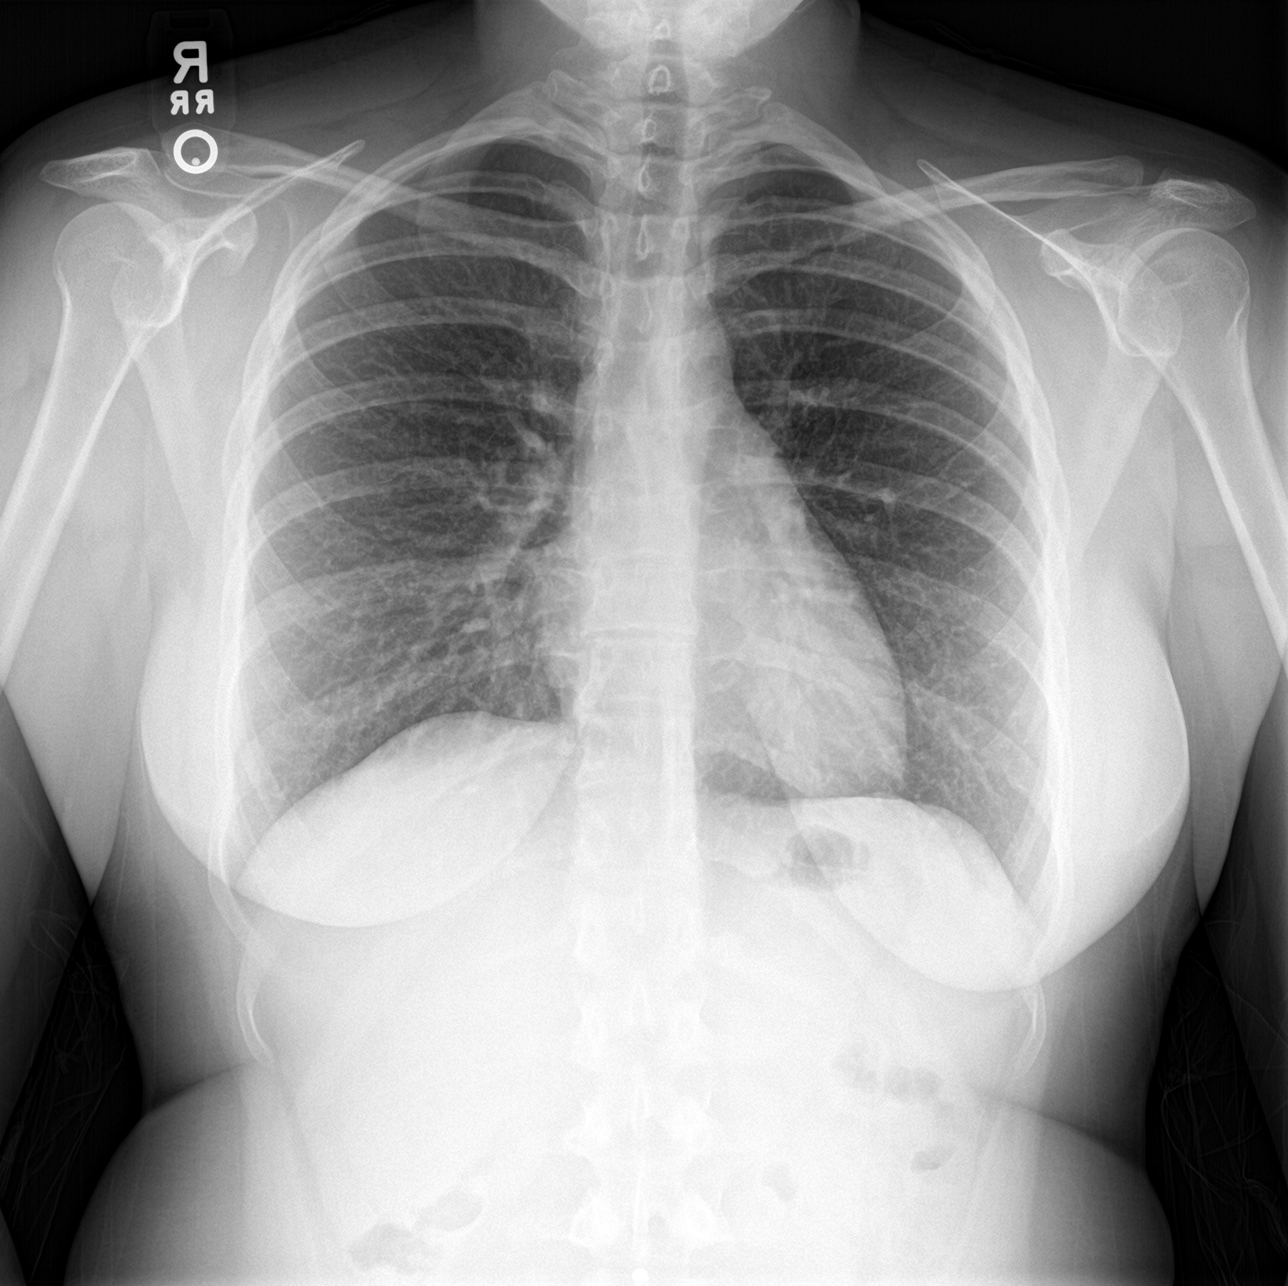

[rib pa]
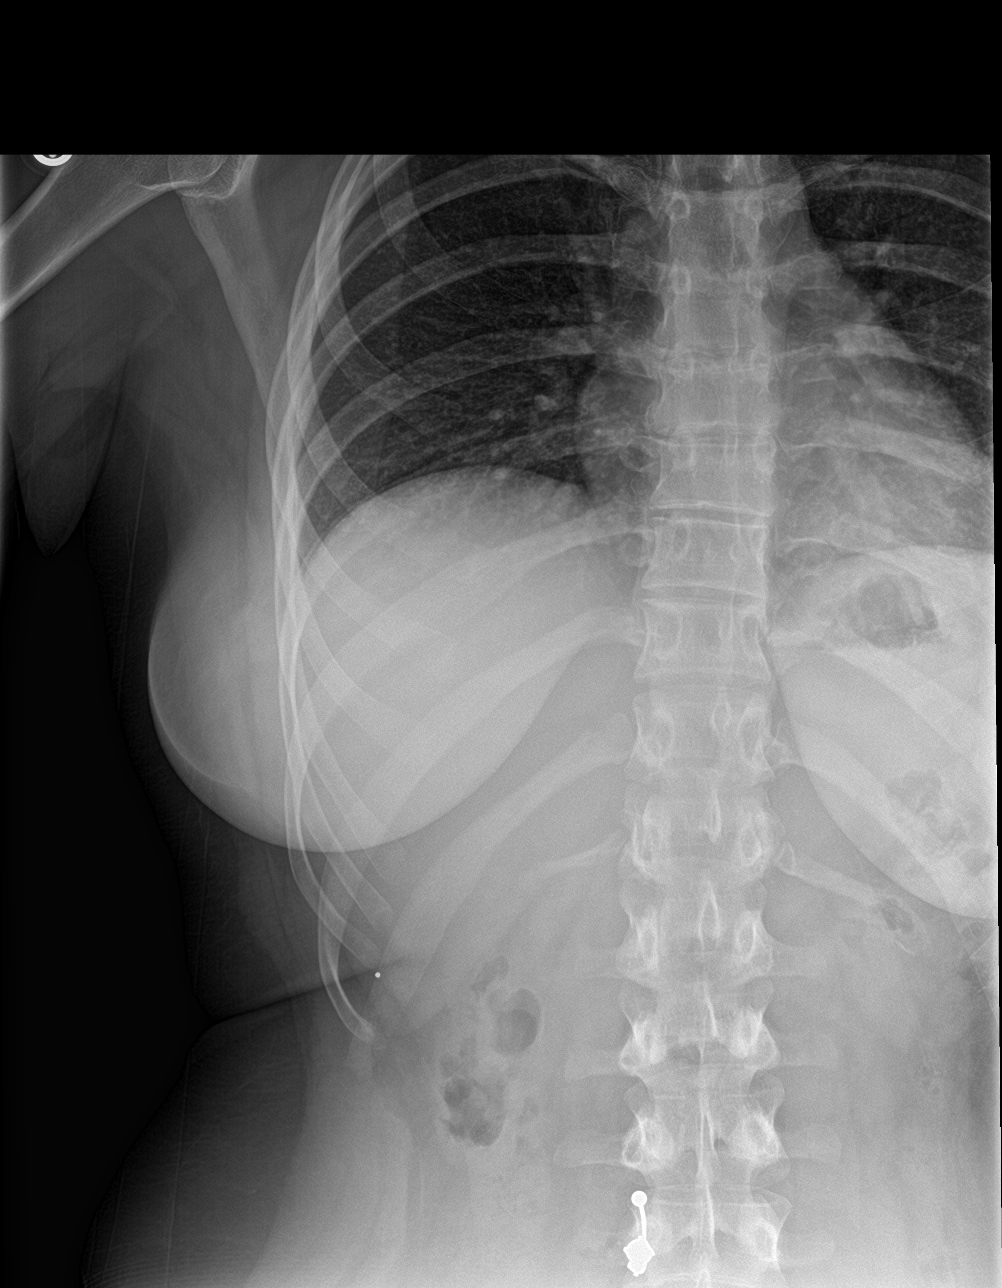

[rib pa obl]
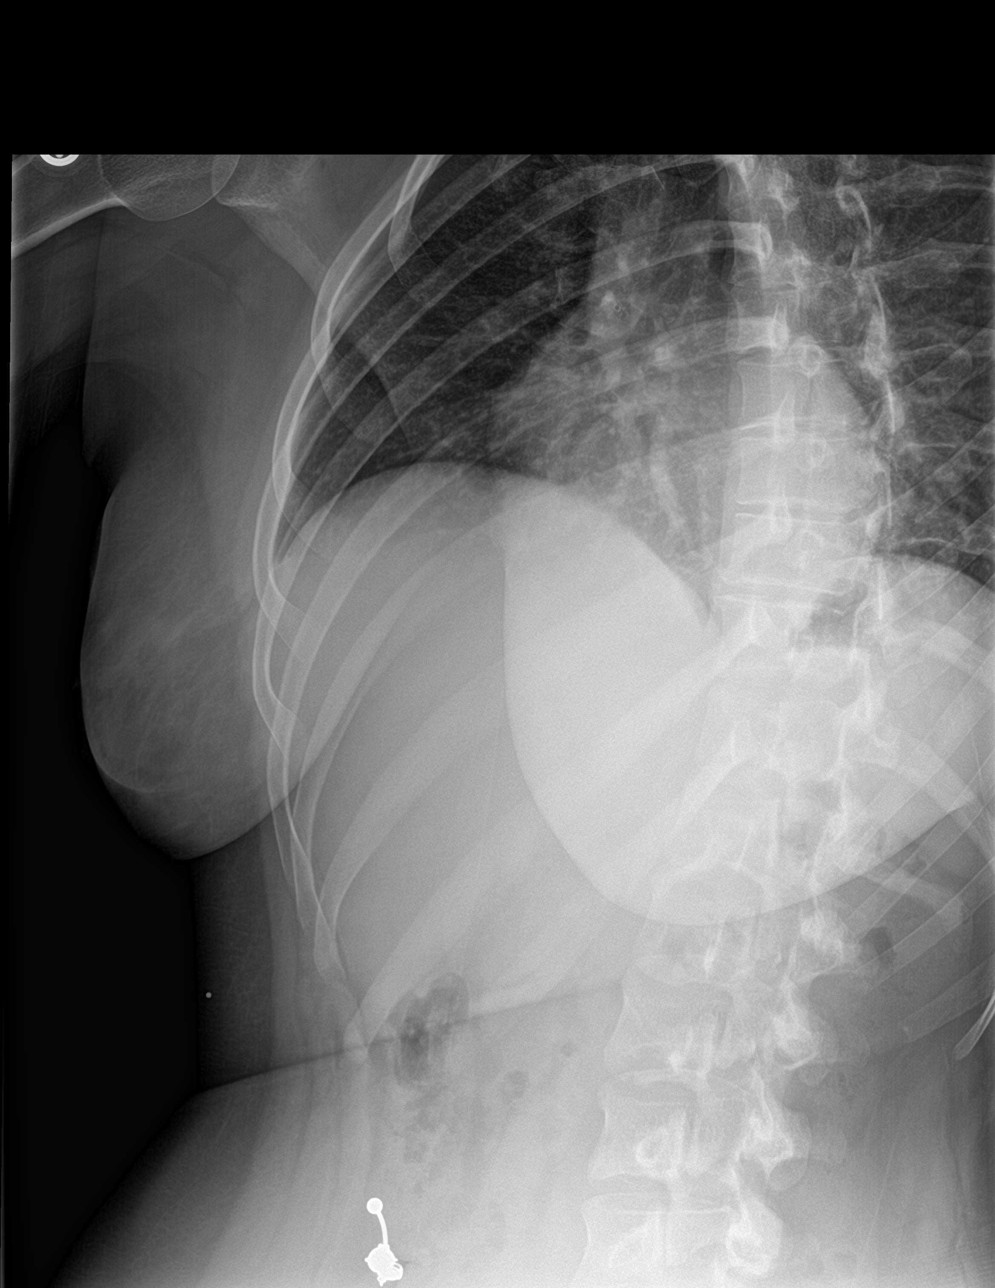

[3 of 3 positions shown; findings below may reference images not displayed]

FINDINGS: The heart size and mediastinal contours are normal. The lungs are
clear. There is no pleural effusion or pneumothorax. A metallic BB
was placed over the right inferior costal margin at the area of
pain. No underlying rib fracture or focal rib abnormality
identified.
IMPRESSION: No evidence of rib fracture, focal rib lesion or active
cardiopulmonary process.

## 2020-12-16 ENCOUNTER — Encounter: Payer: Self-pay | Admitting: Obstetrics and Gynecology

## 2020-12-16 ENCOUNTER — Other Ambulatory Visit: Payer: Self-pay

## 2020-12-16 ENCOUNTER — Other Ambulatory Visit (HOSPITAL_COMMUNITY)
Admission: RE | Admit: 2020-12-16 | Discharge: 2020-12-16 | Disposition: A | Discharge status: Home / Self Care | Payer: Self-pay | Source: Ambulatory Visit | Attending: Obstetrics and Gynecology | Admitting: Obstetrics and Gynecology

## 2020-12-16 ENCOUNTER — Ambulatory Visit: Payer: Self-pay | Admitting: Obstetrics and Gynecology

## 2020-12-16 VITALS — BP 132/83 | HR 111 | Ht 63.0 in | Wt 188.0 lb

## 2020-12-16 DIAGNOSIS — Z3009 Encounter for other general counseling and advice on contraception: Secondary | ICD-10-CM | POA: Insufficient documentation

## 2020-12-16 DIAGNOSIS — Z01419 Encounter for gynecological examination (general) (routine) without abnormal findings: Secondary | ICD-10-CM | POA: Insufficient documentation

## 2020-12-16 MED ORDER — NORGESTIMATE-ETH ESTRADIOL 0.25-35 MG-MCG PO TABS: 1.0000 | ORAL_TABLET | Freq: Every day | ORAL | 11 refills | Status: DC

## 2020-12-16 NOTE — Progress Notes (Signed)
GYNECOLOGY ANNUAL PREVENTATIVE CARE ENCOUNTER NOTE  History:     Melissa Dorsey is a 20 y.o. G0P0000 female here for a routine annual gynecologic exam.  Current complaints: none. Hx of genital warts with treatment. No issues since.    Denies abnormal vaginal bleeding, discharge, pelvic pain, problems with intercourse or other gynecologic concerns.    Gynecologic History Patient's last menstrual period was 12/09/2020. Contraception: OCP (estrogen/progesterone) Last Pap: 02/11/20- Normal  Last mammogram:  Obstetric History OB History  Gravida Para Term Preterm AB Living  0 0 0 0 0 0  SAB IAB Ectopic Multiple Live Births  0 0 0 0 0    History reviewed. No pertinent past medical history.  Past Surgical History:  Procedure Laterality Date  . ADENOIDECTOMY      Current Outpatient Medications on File Prior to Visit  Medication Sig Dispense Refill  . norgestimate-ethinyl estradiol (ORTHO-CYCLEN) 0.25-35 MG-MCG tablet Take 1 tablet by mouth daily. 1 Package 11   No current facility-administered medications on file prior to visit.    No Known Allergies  Social History:  reports that she has never smoked. She has never used smokeless tobacco. She reports that she does not drink alcohol and does not use drugs.  Family History  Problem Relation Age of Onset  . Heart attack Paternal Grandfather   . Hypertension Father     The following portions of the patient's history were reviewed and updated as appropriate: allergies, current medications, past family history, past medical history, past social history, past surgical history and problem list.  Review of Systems Pertinent items noted in HPI and remainder of comprehensive ROS otherwise negative.  Physical Exam:  BP 132/83   Pulse (!) 111   Ht 5\' 3"  (1.6 m)   Wt 188 lb (85.3 kg)   LMP 12/09/2020   BMI 33.30 kg/m  CONSTITUTIONAL: Well-developed, well-nourished female in no acute distress.  HENT:  Normocephalic, atraumatic,  External right and left ear normal.  EYES: Conjunctivae and EOM are normal. Pupils are equal, round, and reactive to light. No scleral icterus.  NECK: Normal range of motion, supple, no masses.  Normal thyroid.  SKIN: Skin is warm and dry. No rash noted. Not diaphoretic. No erythema. No pallor. MUSCULOSKELETAL: Normal range of motion. No tenderness.  No cyanosis, clubbing, or edema. NEUROLOGIC: Alert and oriented to person, place, and time. Normal reflexes, muscle tone coordination.  PSYCHIATRIC: Normal mood and affect. Normal behavior. Normal judgment and thought content. CARDIOVASCULAR: Normal heart rate noted, regular rhythm RESPIRATORY: Clear to auscultation bilaterally. Effort and breath sounds normal, no problems with respiration noted. BREASTS: Symmetric in size. No masses, tenderness, skin changes, nipple drainage, or lymphadenopathy bilaterally. Performed in the presence of a chaperone. ABDOMEN: Soft, no distention noted.  No tenderness, rebound or guarding.  PELVIC: Normal appearing external genitalia and urethral meatus; normal appearing vaginal mucosa and cervix.  No abnormal discharge noted.  Pap smear obtained.  Normal uterine size, no other palpable masses, no uterine or adnexal tenderness.  Performed in the presence of a chaperone.   Assessment and Plan:   1. Encounter for general counseling and advice on contraceptive management  - norgestimate-ethinyl estradiol (ORTHO-CYCLEN) 0.25-35 MG-MCG tablet; Take 1 tablet by mouth daily.  Dispense: 30 tablet; Refill: 11 - Cytology - PAP( [name])  2. Encounter for annual routine gynecological examination  Pap GC/clamydia    Will follow up results of pap smear and manage accordingly. Routine preventative health maintenance measures emphasized. Please refer  to After Visit Summary for other counseling recommendations.       Melissa Dorsey, Melissa Rutherford, NP [facility] [employer], [facility]

## 2020-12-22 LAB — CYTOLOGY - PAP
Chlamydia: NEGATIVE
Comment: NEGATIVE
Comment: NEGATIVE
Comment: NORMAL
Diagnosis: UNDETERMINED — AB
High risk HPV: POSITIVE — AB
Neisseria Gonorrhea: NEGATIVE

## 2021-02-10 ENCOUNTER — Telehealth: Payer: Self-pay

## 2021-02-10 NOTE — Telephone Encounter (Signed)
Telephoned patient at home number. Left a voice message with [facility] contact information. 

## 2022-02-02 ENCOUNTER — Other Ambulatory Visit: Payer: Self-pay | Admitting: *Deleted

## 2022-02-02 DIAGNOSIS — Z3009 Encounter for other general counseling and advice on contraception: Secondary | ICD-10-CM

## 2022-02-02 MED ORDER — NORGESTIMATE-ETH ESTRADIOL 0.25-35 MG-MCG PO TABS: 1.0000 | ORAL_TABLET | Freq: Every day | ORAL | 0 refills | Status: DC

## 2022-02-02 NOTE — Telephone Encounter (Cosign Needed)
Pt called to make her annual appt with D Simpson,CNM.  Appt is scheduled for 02/15/22 and 1 RF given for her OCP's. ?

## 2022-02-15 ENCOUNTER — Ambulatory Visit (INDEPENDENT_AMBULATORY_CARE_PROVIDER_SITE_OTHER): Payer: Self-pay

## 2022-02-15 ENCOUNTER — Telehealth: Payer: Self-pay | Admitting: *Deleted

## 2022-02-15 VITALS — BP 115/82 | HR 74 | Ht 63.0 in | Wt 194.0 lb

## 2022-02-15 DIAGNOSIS — Z01419 Encounter for gynecological examination (general) (routine) without abnormal findings: Secondary | ICD-10-CM

## 2022-02-15 DIAGNOSIS — Z3041 Encounter for surveillance of contraceptive pills: Secondary | ICD-10-CM

## 2022-02-15 MED ORDER — NORGESTIMATE-ETH ESTRADIOL 0.25-35 MG-MCG PO TABS: 1.0000 | ORAL_TABLET | Freq: Every day | ORAL | 0 refills | Status: DC

## 2022-02-15 NOTE — Telephone Encounter (Signed)
Left patient an urgent message to call the office with insurance update or if she is self pay. If patient is self pay, estimation of visit needs to be given for check in. ?

## 2022-02-15 NOTE — Progress Notes (Signed)
? ?  Subjective:  ?  ? Melissa Dorsey is a 21 y.o. female here at [facility] for a routine exam.  Current complaints: none.   ? ? ?Health Maintenance Due  ?Topic Date Due  ? COVID-19 Vaccine (1) Never done  ? HPV VACCINES (1 - 2-dose series) Never done  ? CHLAMYDIA SCREENING  09/28/2019  ? TETANUS/TDAP  07/07/2020  ?  ? ?Risk factors for chronic health problems: ?Smoking: no ?Alchohol/how much: no ?Pt BMI: Body mass index is 34.37 kg/m?. ?  ?Gynecologic History ?Patient's last menstrual period was 02/05/2022. ?Contraception: oral progesterone-only contraceptive ?Sexual health: monogamous with female partner; no issues ?Last Pap: 12/2020. Results were: ASCUS, +HRHPV ?Last mammogram: n/a ? ?Obstetric History ?OB History  ?Gravida Para Term Preterm AB Living  ?0 0 0 0 0 0  ?SAB IAB Ectopic Multiple Live Births  ?0 0 0 0 0  ? ? ?The following portions of the patient's history were reviewed and updated as appropriate: allergies, current medications, past family history, past medical history, past social history, past surgical history, and problem list. ? ?Review of Systems ?Pertinent items are noted in HPI.  ?  ?Objective:  ? ?BP 115/82   Pulse 74   Ht 5\' 3"  (1.6 m)   Wt 194 lb (88 kg)   LMP 02/05/2022   BMI 34.37 kg/m?  ?VS reviewed, nursing note reviewed,  ?Constitutional: well developed, well nourished, no distress ?HEENT: normocephalic ?CV: normal rate ?Pulm/chest wall: normal effort ?Breast Exam:  Deferred with low risks and shared decision making, discussed recommendation to start mammogram between 40-50 yo/ exam  ?Abdomen: soft ?Neuro: alert and oriented x 3 ?Skin: warm, dry ?Psych: affect normal ?Pelvic exam: Deferred ? ?Assessment/Plan:  ?1. Encounter for well woman exam ?- Pap last year ASCUS +HRHPV ?- Chart review looks like patient was told to f/u for colpo, however patient reports she never heard back from office about follow up ?- Patient is self pay. Referral to [facility] for repeat pap and possible colpo if repeat  pap is abnormal ? ?2. Encounter for birth control pills maintenance ?- Refilled ? ?- norgestimate-ethinyl estradiol (ORTHO-CYCLEN) 0.25-35 MG-MCG tablet; Take 1 tablet by mouth daily.  Dispense: 30 tablet; Refill: 0 ? ? ?Return in about 1 year (around 02/16/2023).  ? ?04/18/2023, CNM ?11:10 AM  ? ?

## 2022-03-01 ENCOUNTER — Other Ambulatory Visit: Payer: Self-pay | Admitting: *Deleted

## 2022-03-01 DIAGNOSIS — Z124 Encounter for screening for malignant neoplasm of cervix: Secondary | ICD-10-CM

## 2022-03-01 NOTE — Progress Notes (Signed)
Patient: Melissa Dorsey           ?Date of Birth: June 04, 2001           ?MRN: [id number] ?Visit Date: 03/01/2022 ?PCP: [facility] ? ?Cervical Cancer Screening ?Do you smoke?: No ?Have you ever had or been told you have an allergy to latex products?: No ?Marital status: Single ?Date of last pap smear: 1-2 yrs ago (12/16/2020 ASC-US/+HPV ([facility])) ?Date of last menstrual period: 03/06/22 ?Number of pregnancies: 0 ?Number of births: 0 ?Have you ever had any of the following? ?Hysterectomy: No ?Tubal ligation (tubes tied): No ?Abnormal bleeding: No ?Abnormal pap smear: Yes (See History) ?Venereal warts: No ?A sex partner with venereal warts: No ?A high risk* sex partner: No ? ?Cervical Exam ? ?Abnormal Observations: Normal Exam. ?Recommendations: Last Pap smear completed 12/16/2020 at [facility] and ASCUS with positive HPV. It was recommended for patient to have a colposcopy completed but no follow up was not completed. Per patient she has no history of an abnormal Pap smear prior to her last Pap smear. Last Pap smear result is available in Epic. Let patient know that if today's Pap smear is normal and HPV negative that her next Pap smear will be due in one year. Informed patient that will follow up with her within the next couple of weeks with results of her Pap smear by phone. Patient verbalized understanding. ? ? ? ? ?Patient's History ?Patient Active Problem List  ? Diagnosis Date Noted  ? Encounter for annual routine gynecological examination 12/16/2020  ? Encounter for general counseling and advice on contraceptive management 12/16/2020  ? IUD (intrauterine device) in place 02/11/2020  ? Genital warts 10/08/2019  ? ?No past medical history on file.  ?Family History  ?Problem Relation Age of Onset  ? Heart attack Paternal Grandfather   ? Hypertension Father   ?  ?Social History  ? ?Occupational History  ? Not on file  ?Tobacco  Use  ? Smoking status: Never  ? Smokeless tobacco: Never  ?Vaping Use  ? Vaping Use: Never used  ?Substance and Sexual Activity  ? Alcohol use: No  ? Drug use: No  ? Sexual activity: Yes  ?  Birth control/protection: Pill  ? ?

## 2022-03-04 LAB — CYTOLOGY - PAP
Comment: NEGATIVE
Diagnosis: UNDETERMINED — AB
High risk HPV: NEGATIVE

## 2022-03-07 ENCOUNTER — Telehealth: Payer: Self-pay

## 2022-03-07 NOTE — Telephone Encounter (Addendum)
Patient returned call, was informed pap results ASC-US/HPV-Negative, next pap due in 1 year. Patient verbalized understanding.  ? ? ?Attempted to contact patient regarding pap results. Left message on voicemail requesting a return call.  ?

## 2023-01-11 ENCOUNTER — Telehealth: Payer: Self-pay | Admitting: *Deleted

## 2023-01-11 NOTE — Telephone Encounter (Signed)
Left patient a message to call and schedule annual.

## 2023-01-18 ENCOUNTER — Telehealth: Payer: Self-pay | Admitting: *Deleted

## 2023-01-18 NOTE — Telephone Encounter (Signed)
Returned call from 4:21 PM. Left patient a message to call and schedule.

## 2023-02-27 NOTE — Progress Notes (Deleted)
Last pap- 03/01/22- ascus

## 2023-02-28 ENCOUNTER — Ambulatory Visit: Payer: Self-pay

## 2023-03-07 ENCOUNTER — Ambulatory Visit: Payer: Self-pay | Admitting: Certified Nurse Midwife

## 2023-03-08 ENCOUNTER — Other Ambulatory Visit: Payer: Self-pay

## 2023-03-08 DIAGNOSIS — Z3041 Encounter for surveillance of contraceptive pills: Secondary | ICD-10-CM

## 2023-03-08 MED ORDER — NORGESTIMATE-ETH ESTRADIOL 0.25-35 MG-MCG PO TABS: 1.0000 | ORAL_TABLET | Freq: Every day | ORAL | 0 refills | Status: DC

## 2023-03-08 NOTE — Progress Notes (Signed)
One refill of OCP sent until pt's annual appt 

## 2023-04-05 ENCOUNTER — Ambulatory Visit (INDEPENDENT_AMBULATORY_CARE_PROVIDER_SITE_OTHER): Payer: Self-pay | Admitting: Advanced Practice Midwife

## 2023-04-05 ENCOUNTER — Other Ambulatory Visit (HOSPITAL_COMMUNITY)
Admission: RE | Admit: 2023-04-05 | Discharge: 2023-04-05 | Disposition: A | Discharge status: Home / Self Care | Payer: Self-pay | Source: Ambulatory Visit | Attending: Advanced Practice Midwife | Admitting: Advanced Practice Midwife

## 2023-04-05 ENCOUNTER — Encounter: Payer: Self-pay | Admitting: Advanced Practice Midwife

## 2023-04-05 VITALS — BP 123/85 | HR 79 | Ht 63.0 in | Wt 195.0 lb

## 2023-04-05 DIAGNOSIS — Z124 Encounter for screening for malignant neoplasm of cervix: Secondary | ICD-10-CM

## 2023-04-05 DIAGNOSIS — R87610 Atypical squamous cells of undetermined significance on cytologic smear of cervix (ASC-US): Secondary | ICD-10-CM

## 2023-04-05 DIAGNOSIS — R87810 Cervical high risk human papillomavirus (HPV) DNA test positive: Secondary | ICD-10-CM

## 2023-04-05 DIAGNOSIS — Z3041 Encounter for surveillance of contraceptive pills: Secondary | ICD-10-CM

## 2023-04-05 MED ORDER — NORGESTIMATE-ETH ESTRADIOL 0.25-35 MG-MCG PO TABS: 1.0000 | ORAL_TABLET | Freq: Every day | ORAL | 11 refills | Status: DC

## 2023-04-05 NOTE — Progress Notes (Signed)
GYNECOLOGY ANNUAL PREVENTATIVE CARE ENCOUNTER NOTE  History:     Corona Sanderson is a 22 y.o. G0P0000 female here for a routine annual gynecologic exam.  Current complaints: None.   Denies abnormal vaginal bleeding, discharge, pelvic pain, problems with intercourse or other gynecologic concerns.    Gynecologic History Patient's last menstrual period was 03/26/2023. Contraception: OCP (estrogen/progesterone) Pap Hx: 2021: Nml 2022: ASCUS + HRHPV 2023: ASCUS - HPV  Last mammogram: None   Upstream - 04/05/23 0929       Pregnancy Intention Screening   Does the patient want to become pregnant in the next year? No    Does the patient's partner want to become pregnant in the next year? No    Would the patient like to discuss contraceptive options today? No      Contraception Wrap Up   Current Method Oral Contraceptive    End Method Oral Contraceptive    Contraception Counseling Provided No    How was the end contraceptive method provided? Prescription            The pregnancy intention screening data noted above was reviewed. Potential methods of contraception were discussed. The patient elected to proceed with Oral Contraceptive.   Obstetric History OB History  Gravida Para Term Preterm AB Living  0 0 0 0 0 0  SAB IAB Ectopic Multiple Live Births  0 0 0 0 0    History reviewed. No pertinent past medical history.  Past Surgical History:  Procedure Laterality Date   ADENOIDECTOMY      No current outpatient medications on file prior to visit.   No current facility-administered medications on file prior to visit.    No Known Allergies  Social History:  reports that she has never smoked. She has never used smokeless tobacco. She reports that she does not drink alcohol and does not use drugs.  Family History  Problem Relation Age of Onset   Heart attack Paternal Grandfather    Hypertension Father     The following portions of the patient's history were  reviewed and updated as appropriate: allergies, current medications, past family history, past medical history, past social history, past surgical history and problem list.  Review of Systems Review of Systems  Constitutional:  Negative for chills and fever.  Gastrointestinal:  Negative for abdominal pain.  Genitourinary:  Negative for dyspareunia, genital sores, menstrual problem (spotting when missed pills), pelvic pain, vaginal bleeding and vaginal discharge.     Physical Exam:  BP 123/85   Pulse 79   Ht 5\' 3"  (1.6 m)   Wt 195 lb (88.5 kg)   LMP 03/26/2023   BMI 34.54 kg/m  CONSTITUTIONAL: Well-developed, well-nourished female in no acute distress.  HENT:  Normocephalic, atraumatic. Oropharynx is clear and moist EYES: Conjunctivae normal. No scleral icterus.  SKIN: Skin is warm and dry. No rash noted. Not diaphoretic. No erythema. No pallor. MUSCULOSKELETAL: Normal range of motion. No tenderness.  No cyanosis or edema.   NEUROLOGIC: Alert and oriented to person, place, and time. Normal muscle tone coordination.  PSYCHIATRIC: Normal mood and affect. Normal behavior. Normal judgment and thought content. CARDIOVASCULAR: Normal heart rate noted. RESPIRATORY: Effort and rate normal. BREASTS: Declined ABDOMEN: Soft, no distention, tenderness, rebound or guarding.  PELVIC: Normal appearing external genitalia; normal appearing vaginal mucosa and cervix.  No abnormal discharge noted.  Pap smear obtained.   Assessment and Plan:    1. Encounter for birth control pills maintenance - norgestimate-ethinyl estradiol (ORTHO-CYCLEN)  0.25-35 MG-MCG tablet; Take 1 tablet by mouth daily.  Dispense: 30 tablet; Refill: 11  2. Encounter for Papanicolaou smear for cervical cancer screening - Cytology - PAP( [facility])  3. Atypical squamous cells of undetermined significance (ASCUS) on Papanicolaou smear of cervix - Cytology - PAP( [place])  Will follow up results of pap smear and manage  accordingly. Mammogram not indicated. Routine preventative health maintenance measures emphasized. Please refer to After Visit Summary for other counseling recommendations.      Dorathy Kinsman, CNM [facility] [employer], [facility]

## 2023-04-05 NOTE — Progress Notes (Signed)
Last pap- 03/01/22- ASCUS Needs refill of OCP

## 2023-04-11 LAB — CYTOLOGY - PAP
Chlamydia: NEGATIVE
Comment: NEGATIVE
Comment: NEGATIVE
Comment: NORMAL
Neisseria Gonorrhea: NEGATIVE
Trichomonas: NEGATIVE

## 2023-04-12 DIAGNOSIS — R87610 Atypical squamous cells of undetermined significance on cytologic smear of cervix (ASC-US): Secondary | ICD-10-CM | POA: Insufficient documentation

## 2023-04-20 ENCOUNTER — Telehealth: Payer: Self-pay | Admitting: *Deleted

## 2023-04-20 NOTE — Telephone Encounter (Signed)
Message sent to [facility] for scheduling due to patient being uninsured.

## 2023-04-20 NOTE — Telephone Encounter (Signed)
Returned call from 1:55 PM. Left patient a message to call and schedule Colpo with a doctor and needs self pay amount if no insurance.

## 2023-04-24 ENCOUNTER — Telehealth: Payer: Self-pay

## 2023-04-24 NOTE — Telephone Encounter (Signed)
Telephoned patient at mobile number. Left a voice message with [facility] contact information. 

## 2023-04-25 NOTE — Telephone Encounter (Signed)
Telephoned patient at mobile number. Left a voice message with [facility] contact information. 

## 2023-11-17 ENCOUNTER — Other Ambulatory Visit: Payer: Self-pay

## 2023-11-17 ENCOUNTER — Ambulatory Visit
Admission: RE | Admit: 2023-11-17 | Discharge: 2023-11-17 | Disposition: A | Discharge status: Home / Self Care | Payer: Self-pay | Source: Ambulatory Visit | Attending: Family Medicine | Admitting: Family Medicine

## 2023-11-17 VITALS — BP 125/84 | HR 88 | Temp 98.1°F | Resp 16

## 2023-11-17 DIAGNOSIS — R059 Cough, unspecified: Secondary | ICD-10-CM

## 2023-11-17 DIAGNOSIS — J101 Influenza due to other identified influenza virus with other respiratory manifestations: Secondary | ICD-10-CM

## 2023-11-17 LAB — POC SARS CORONAVIRUS 2 AG -  ED: SARS Coronavirus 2 Ag: NEGATIVE

## 2023-11-17 LAB — POCT INFLUENZA A/B
Influenza A, POC: POSITIVE — AB
Influenza B, POC: NEGATIVE

## 2023-11-17 MED ORDER — PROMETHAZINE-DM 6.25-15 MG/5ML PO SYRP: 5.0000 mL | ORAL_SOLUTION | Freq: Two times a day (BID) | ORAL | 0 refills | Status: DC | PRN

## 2023-11-17 MED ORDER — PREDNISONE 20 MG PO TABS
ORAL_TABLET | ORAL | 0 refills | Status: DC
Start: 2023-11-17 — End: 2024-04-02

## 2023-11-17 MED ORDER — OSELTAMIVIR PHOSPHATE 75 MG PO CAPS: 75.0000 mg | ORAL_CAPSULE | Freq: Two times a day (BID) | ORAL | 0 refills | Status: DC

## 2023-11-17 MED ORDER — BENZONATATE 200 MG PO CAPS: 200.0000 mg | ORAL_CAPSULE | Freq: Three times a day (TID) | ORAL | 0 refills | Status: AC | PRN

## 2023-11-17 NOTE — Discharge Instructions (Addendum)
 Advised patient to take medication as directed with food to completion.  Advised patient to take prednisone  with first dose of Tamiflu  for the next 5 days.  Advised may use Tessalon  capsules daily or as needed for cough.  Advised may use Promethazine  DM at night for cough prior to sleep due to sedative effects.  Encouraged to increase daily water intake to 64 ounces per day while taking these medications.  Advised if symptoms worsen and/or unresolved please follow-up with PCP or here for further evaluation.

## 2023-11-17 NOTE — ED Triage Notes (Signed)
 Pt presents to [facility] with co of uri symptoms chest congestion, sore throat, back pain, and cough for 2 days. Pt reports otc motrin and musinex

## 2023-11-17 NOTE — ED Provider Notes (Signed)
 [facility]    CSN: [id number] Arrival date & time: 11/17/23  1350      History   Chief Complaint Chief Complaint  Patient presents with   URI    HPI Melissa Dorsey is a 23 y.o. female.   HPI 23 year old female presents with cough and is concerned with possible respiratory infection.  PMH significant for obesity and genital warts.  History reviewed. No pertinent past medical history.  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 Authorizing Provider  benzonatate  (TESSALON ) 200 MG capsule Take 1 capsule (200 mg total) by mouth 3 (three) times daily as needed for up to 7 days. 11/17/23 11/24/23 Yes Teddy Sharper, FNP  oseltamivir  (TAMIFLU ) 75 MG capsule Take 1 capsule (75 mg total) by mouth every 12 (twelve) hours. 11/17/23  Yes Teddy Sharper, FNP  predniSONE  (DELTASONE ) 20 MG tablet Take 3 tabs PO daily x 5 days. 11/17/23  Yes Teddy Sharper, FNP  promethazine -dextromethorphan (PROMETHAZINE -DM) 6.25-15 MG/5ML syrup Take 5 mLs by mouth 2 (two) times daily as needed for cough. 11/17/23  Yes Teddy Sharper, FNP  norgestimate -ethinyl estradiol  (ORTHO-CYCLEN) 0.25-35 MG-MCG tablet Take 1 tablet by mouth daily. 04/05/23   Smith, [place] , CNM    Family History Family History  Problem Relation Age of Onset   Heart attack Paternal Grandfather    Hypertension Father     Social History Social History   Tobacco Use   Smoking status: Never   Smokeless tobacco: Never  Vaping Use   Vaping status: Never Used  Substance Use Topics    Alcohol use: No   Drug use: No     Allergies   Patient has no known allergies.   Review of Systems Review of Systems   Physical Exam Triage Vital Signs ED Triage Vitals  Encounter Vitals Group     BP      Systolic BP Percentile      Diastolic BP Percentile      Pulse      Resp      Temp      Temp src      SpO2      Weight      Height      Head Circumference      Peak Flow      Pain Score      Pain Loc      Pain Education      Exclude from Growth Chart    No data found.  Updated Vital Signs BP 125/84   Pulse 88   Temp 98.1 F (36.7 C)   Resp 16   LMP 11/09/2023   SpO2 98%    Physical Exam Vitals and nursing note reviewed.  Constitutional:      Appearance: Normal appearance. She  is obese. She is ill-appearing.  HENT:     Head: Normocephalic and atraumatic.     Right Ear: Tympanic membrane, ear canal and external ear normal.     Left Ear: Tympanic membrane, ear canal and external ear normal.     Mouth/Throat:     Mouth: Mucous membranes are moist.     Pharynx: Oropharynx is clear.  Eyes:     Extraocular Movements: Extraocular movements intact.     Conjunctiva/sclera: Conjunctivae normal.     Pupils: Pupils are equal, round, and reactive to light.  Cardiovascular:     Rate and Rhythm: Normal rate and regular rhythm.     Pulses: Normal pulses.     Heart sounds: Normal heart sounds.  Pulmonary:     Effort: Pulmonary effort is normal.     Breath sounds: Normal breath sounds. No wheezing, rhonchi or rales.     Comments: Infrequent nonproductive cough on exam Musculoskeletal:        General: Normal range of motion.     Cervical back: Normal range of motion and neck supple.  Skin:    General: Skin is warm and dry.  Neurological:     General: No focal deficit present.     Mental Status: She is alert and oriented to person, place, and time. Mental status is at baseline.  Psychiatric:        Mood and Affect: Mood normal.        Behavior: Behavior  normal.      UC Treatments / Results  Labs (all labs ordered are listed, but only abnormal results are displayed) Labs Reviewed  POCT INFLUENZA A/B - Abnormal; Notable for the following components:      Result Value   Influenza A, POC Positive (*)    All other components within normal limits  POC SARS CORONAVIRUS 2 AG -  ED    EKG   Radiology No results found.  Procedures Procedures (including critical care time)  Medications Ordered in UC Medications - No data to display  Initial Impression / Assessment and Plan / UC Course  I have reviewed the triage vital signs and the nursing notes.  Pertinent labs & imaging results that were available during my care of the patient were reviewed by me and considered in my medical decision making (see chart for details).     MDM: 1.  Influenza A-Rx'd Tamiflu  75 mg capsule: Take 1 capsule twice daily x 5 days; 2.  Cough, unspecified type-Rx'd prednisone  20 mg tablet: Take 3 tablets p.o. daily x 5 days, Rx'd Tessalon  200 mg capsules: Take 1 capsule 3 times daily, as needed. Advised patient to take medication as directed with food to completion.  Advised patient to take prednisone  with first dose of Tamiflu  for the next 5 days.  Advised may use Tessalon  capsules daily or as needed for cough.  Advised may use Promethazine  DM at night for cough prior to sleep due to sedative effects.  Encouraged to increase daily water intake to 64 ounces per day while taking these medications.  Advised if symptoms worsen and/or unresolved please follow-up with PCP or here for further evaluation. Final Clinical Impressions(s) / UC Diagnoses   Final diagnoses:  Cough, unspecified type  Influenza A     Discharge Instructions      Advised patient to take medication as directed with food to completion.  Advised patient to take prednisone  with first dose of Tamiflu  for the next 5 days.  Advised may use  Tessalon  capsules daily or as needed for cough.  Advised  may use Promethazine  DM at night for cough prior to sleep due to sedative effects.  Encouraged to increase daily water intake to 64 ounces per day while taking these medications.  Advised if symptoms worsen and/or unresolved please follow-up with PCP or here for further evaluation.     ED Prescriptions     Medication Sig Dispense Auth. Provider   oseltamivir  (TAMIFLU ) 75 MG capsule Take 1 capsule (75 mg total) by mouth every 12 (twelve) hours. 10 capsule Gaylen Venning, FNP   predniSONE  (DELTASONE ) 20 MG tablet Take 3 tabs PO daily x 5 days. 15 tablet Gatsby Chismar, FNP   benzonatate  (TESSALON ) 200 MG capsule Take 1 capsule (200 mg total) by mouth 3 (three) times daily as needed for up to 7 days. 40 capsule Torryn Hudspeth, FNP   promethazine -dextromethorphan (PROMETHAZINE -DM) 6.25-15 MG/5ML syrup Take 5 mLs by mouth 2 (two) times daily as needed for cough. 118 mL Lovell Roe, FNP      PDMP not reviewed this encounter.   Teddy Sharper, FNP 11/17/23 1450

## 2024-03-27 ENCOUNTER — Telehealth: Payer: Self-pay | Admitting: *Deleted

## 2024-03-27 NOTE — Telephone Encounter (Signed)
Left patient a message to call and schedule annual. 

## 2024-04-02 ENCOUNTER — Ambulatory Visit
Admission: RE | Admit: 2024-04-02 | Discharge: 2024-04-02 | Disposition: A | Discharge status: Home / Self Care | Payer: Self-pay | Source: Ambulatory Visit | Attending: Family Medicine | Admitting: Family Medicine

## 2024-04-02 VITALS — BP 128/83 | HR 89 | Temp 99.1°F | Resp 17

## 2024-04-02 DIAGNOSIS — H6691 Otitis media, unspecified, right ear: Secondary | ICD-10-CM

## 2024-04-02 MED ORDER — AMOXICILLIN-POT CLAVULANATE 875-125 MG PO TABS: 1.0000 | ORAL_TABLET | Freq: Two times a day (BID) | ORAL | 0 refills | Status: AC

## 2024-04-02 NOTE — ED Provider Notes (Signed)
 [facility]    CSN: [id number] Arrival date & time: 04/02/24  1528      History   Chief Complaint Chief Complaint  Patient presents with   Otalgia    RT    HPI Melissa Dorsey is a 23 y.o. female.   HPI 23 year old female presents with bilateral ear pain for 3 days.  Reports worse on the right.  PMH significant for obesity and ASCUS with positive high risk HPV cervical.  History reviewed. No pertinent past medical history.  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 Authorizing Provider  amoxicillin -clavulanate (AUGMENTIN) 875-125 MG tablet Take 1 tablet by mouth 2 (two) times daily for 10 days. 04/02/24 04/12/24 Yes Leonides Ramp, FNP  norgestimate -ethinyl estradiol  (ORTHO-CYCLEN) 0.25-35 MG-MCG tablet Take 1 tablet by mouth daily. 04/05/23   Smith, [place] , CNM    Family History Family History  Problem Relation Age of Onset   Heart attack Paternal Grandfather    Hypertension Father     Social History Social History   Tobacco Use   Smoking status: Never   Smokeless tobacco: Never  Vaping Use   Vaping status: Never Used  Substance Use Topics   Alcohol use: No   Drug use: No     Allergies   Patient has no known allergies.   Review of Systems Review of Systems  HENT:  Positive for ear pain.   All other systems reviewed and are negative.    Physical Exam Triage Vital Signs ED Triage Vitals  Encounter Vitals Group     BP      Systolic BP Percentile      Diastolic BP Percentile      Pulse       Resp      Temp      Temp src      SpO2      Weight      Height      Head Circumference      Peak Flow      Pain Score      Pain Loc      Pain Education      Exclude from Growth Chart    No data found.  Updated Vital Signs BP 128/83 (BP Location: Right Arm)   Pulse 89   Temp 99.1 F (37.3 C) (Oral)   Resp 17   LMP 03/28/2024 (Exact Date)   SpO2 99%      Physical Exam Vitals and nursing note reviewed.  Constitutional:      Appearance: Normal appearance. She is obese.  HENT:     Head: Normocephalic and atraumatic.     Right Ear: External ear normal.     Left Ear: Tympanic membrane and external ear normal.  Ears:     Comments: Right TM: Erythematous, bulging; moderate eustachian tube dysfunction noted bilaterally    Mouth/Throat:     Mouth: Mucous membranes are moist.     Pharynx: Oropharynx is clear.  Eyes:     Extraocular Movements: Extraocular movements intact.     Conjunctiva/sclera: Conjunctivae normal.     Pupils: Pupils are equal, round, and reactive to light.  Cardiovascular:     Rate and Rhythm: Normal rate and regular rhythm.     Pulses: Normal pulses.     Heart sounds: Normal heart sounds.  Pulmonary:     Effort: Pulmonary effort is normal.     Breath sounds: Normal breath sounds. No wheezing, rhonchi or rales.  Musculoskeletal:        General: Normal range of motion.  Skin:    General: Skin is warm and dry.  Neurological:     General: No focal deficit present.     Mental Status: She is alert and oriented to person, place, and time.  Psychiatric:        Mood and Affect: Mood normal.        Behavior: Behavior normal.        Thought Content: Thought content normal.      UC Treatments / Results  Labs (all labs ordered are listed, but only abnormal results are displayed) Labs Reviewed - No data to display  EKG   Radiology No results found.  Procedures Procedures (including critical care time)  Medications Ordered in UC Medications  - No data to display  Initial Impression / Assessment and Plan / UC Course  I have reviewed the triage vital signs and the nursing notes.  Pertinent labs & imaging results that were available during my care of the patient were reviewed by me and considered in my medical decision making (see chart for details).     MDM: 1.  Acute right otitis media-Rx'd Augmentin 875/125 mg tablet: Take 1 tablet twice daily x 10 days. Advised patient to take medication as directed with food to completion.  Encouraged increase daily water intake to 64 ounces per day while taking this medication.  Advised if symptoms worsen and/or unresolved please follow-up with your PCP, ENT, or here for further evaluation.  Patient discharged home, hemodynamically stable. Final Clinical Impressions(s) / UC Diagnoses   Final diagnoses:  Acute right otitis media     Discharge Instructions      Advised patient to take medication as directed with food to completion.  Encouraged increase daily water intake to 64 ounces per day while taking this medication.  Advised if symptoms worsen and/or unresolved please follow-up with your PCP, ENT, or here for further evaluation.   ED Prescriptions     Medication Sig Dispense Auth. Provider   amoxicillin -clavulanate (AUGMENTIN) 875-125 MG tablet Take 1 tablet by mouth 2 (two) times daily for 10 days. 20 tablet Trooper Olander, FNP      PDMP not reviewed this encounter.   Leonides Ramp, FNP 04/02/24 [phone]

## 2024-04-02 NOTE — ED Triage Notes (Signed)
 Pt c/o bilateral ear pain x 2-3 days. Worse on RT. Tylenol prn.

## 2024-04-02 NOTE — Discharge Instructions (Addendum)
 Advised patient to take medication as directed with food to completion. Encouraged increase daily water intake to 64 ounces per day while taking this medication.  Advised if symptoms worsen and/or unresolved please follow-up with your PCP, ENT, or here for further evaluation.

## 2024-04-12 ENCOUNTER — Ambulatory Visit: Payer: Self-pay | Admitting: Certified Nurse Midwife

## 2024-04-19 ENCOUNTER — Other Ambulatory Visit (HOSPITAL_COMMUNITY)
Admission: RE | Admit: 2024-04-19 | Discharge: 2024-04-19 | Disposition: A | Discharge status: Home / Self Care | Payer: Self-pay | Source: Ambulatory Visit | Attending: Obstetrics and Gynecology | Admitting: Obstetrics and Gynecology

## 2024-04-19 ENCOUNTER — Ambulatory Visit (INDEPENDENT_AMBULATORY_CARE_PROVIDER_SITE_OTHER): Payer: Self-pay | Admitting: Obstetrics and Gynecology

## 2024-04-19 ENCOUNTER — Ambulatory Visit: Payer: Self-pay

## 2024-04-19 ENCOUNTER — Encounter: Payer: Self-pay | Admitting: Obstetrics and Gynecology

## 2024-04-19 VITALS — BP 112/76 | HR 87 | Ht 64.0 in | Wt 158.0 lb

## 2024-04-19 DIAGNOSIS — Z01419 Encounter for gynecological examination (general) (routine) without abnormal findings: Secondary | ICD-10-CM | POA: Insufficient documentation

## 2024-04-19 DIAGNOSIS — Z3041 Encounter for surveillance of contraceptive pills: Secondary | ICD-10-CM

## 2024-04-19 DIAGNOSIS — Z1331 Encounter for screening for depression: Secondary | ICD-10-CM

## 2024-04-19 MED ORDER — NORGESTIMATE-ETH ESTRADIOL 0.25-35 MG-MCG PO TABS: 1.0000 | ORAL_TABLET | Freq: Every day | ORAL | 11 refills | Status: AC

## 2024-04-19 NOTE — Progress Notes (Signed)
 GYNECOLOGY ANNUAL PREVENTATIVE CARE ENCOUNTER NOTE  History:     Melissa Dorsey is a 23 y.o. G0P0000 female here for a routine annual gynecologic exam.  Current complaints: None.   Denies abnormal vaginal bleeding, discharge, pelvic pain, problems with intercourse or other gynecologic concerns.   She is self pay.  Gynecologic History Patient's last menstrual period was 03/28/2024 (exact date). Contraception: OCP (estrogen/progesterone) Last Pap:  2021: Nml 2022: ASCUS + HRHPV 2023: ASCUS - HPV 2024: LSIL-->recommend Colpo> no colpo 2025: pap collected and pending   Obstetric History OB History  Gravida Para Term Preterm AB Living  0 0 0 0 0 0  SAB IAB Ectopic Multiple Live Births  0 0 0 0 0    History reviewed. No pertinent past medical history.  Past Surgical History:  Procedure Laterality Date   ADENOIDECTOMY      Current Outpatient Medications on File Prior to Visit  Medication Sig Dispense Refill   norgestimate -ethinyl estradiol  (ORTHO-CYCLEN) 0.25-35 MG-MCG tablet Take 1 tablet by mouth daily. 30 tablet 11   No current facility-administered medications on file prior to visit.    No Known Allergies  Social History:  reports that she has never smoked. She has never used smokeless tobacco. She reports that she does not drink alcohol and does not use drugs.  Family History  Problem Relation Age of Onset   Heart attack Paternal Grandfather    Hypertension Father     The following portions of the patient's history were reviewed and updated as appropriate: allergies, current medications, past family history, past medical history, past social history, past surgical history and problem list.  Review of Systems Pertinent items noted in HPI and remainder of comprehensive ROS otherwise negative.  Physical Exam:  BP 112/76   Pulse 87   Ht 5' 4 (1.626 m)   Wt 158 lb (71.7 kg)   LMP 03/28/2024 (Exact Date)   BMI 27.12 kg/m  CONSTITUTIONAL: Well-developed,  well-nourished female in no acute distress.  HENT:  Normocephalic, atraumatic, External right and left ear normal. Oropharynx is clear and moist EYES: Conjunctivae and EOM are normal. Pupils are equal, round, and reactive to light. No scleral icterus.  NECK: Normal range of motion, supple, no masses.  Normal thyroid.  SKIN: Skin is warm and dry. No rash noted. Not diaphoretic. No erythema. No pallor. MUSCULOSKELETAL: Normal range of motion. No tenderness.  No cyanosis, clubbing, or edema.  2+ distal pulses. NEUROLOGIC: Alert and oriented to person, place, and time. Normal reflexes, muscle tone coordination.  PSYCHIATRIC: Normal mood and affect. Normal behavior. Normal judgment and thought content. CARDIOVASCULAR: Normal heart rate noted, regular rhythm RESPIRATORY: Clear to auscultation bilaterally. Effort and breath sounds normal, no problems with respiration noted. BREASTS: Symmetric in size. No masses, tenderness, skin changes, nipple drainage, or lymphadenopathy bilaterally. Performed in the presence of a chaperone. ABDOMEN: Soft, no distention noted.  No tenderness, rebound or guarding.  PELVIC: Normal appearing external genitalia and urethral meatus; normal appearing vaginal mucosa and cervix.  No abnormal discharge noted.  Pap smear obtained.  Normal uterine size, no other palpable masses, no uterine or adnexal tenderness.  Performed in the presence of a chaperone.   Assessment and Plan:   1. Women's annual routine gynecological examination (Primary)  Recommend Gardisil vaccine: Cost is to high in this office She can go to [facility] or HD Pap today.  Refill on BC x 1 year    Will follow up results of pap smear and manage  accordingly. Routine preventative health maintenance measures emphasized. Please refer to After Visit Summary for other counseling recommendations.     Treyshon Buchanon, Juliette Oh, NP [facility] [employer], [facility]

## 2024-04-24 LAB — CYTOLOGY - PAP
Comment: NEGATIVE
Comment: NEGATIVE
Comment: NEGATIVE
Diagnosis: UNDETERMINED — AB
HPV 16: NEGATIVE
HPV 18 / 45: NEGATIVE
High risk HPV: POSITIVE — AB

## 2024-04-26 ENCOUNTER — Ambulatory Visit: Payer: Self-pay | Admitting: Obstetrics and Gynecology

## 2024-05-09 ENCOUNTER — Other Ambulatory Visit (HOSPITAL_COMMUNITY)
Admission: RE | Admit: 2024-05-09 | Discharge: 2024-05-09 | Disposition: A | Discharge status: Home / Self Care | Payer: Self-pay | Source: Ambulatory Visit | Attending: Obstetrics and Gynecology | Admitting: Obstetrics and Gynecology

## 2024-05-09 ENCOUNTER — Ambulatory Visit (INDEPENDENT_AMBULATORY_CARE_PROVIDER_SITE_OTHER): Payer: Self-pay | Admitting: Obstetrics and Gynecology

## 2024-05-09 ENCOUNTER — Encounter: Payer: Self-pay | Admitting: Obstetrics and Gynecology

## 2024-05-09 VITALS — BP 109/74 | HR 96 | Ht 64.0 in | Wt 154.0 lb

## 2024-05-09 DIAGNOSIS — R87810 Cervical high risk human papillomavirus (HPV) DNA test positive: Secondary | ICD-10-CM | POA: Insufficient documentation

## 2024-05-09 DIAGNOSIS — Z3202 Encounter for pregnancy test, result negative: Secondary | ICD-10-CM

## 2024-05-09 DIAGNOSIS — R87610 Atypical squamous cells of undetermined significance on cytologic smear of cervix (ASC-US): Secondary | ICD-10-CM

## 2024-05-09 LAB — POCT URINE PREGNANCY: Preg Test, Ur: NEGATIVE

## 2024-05-09 NOTE — Progress Notes (Signed)
    GYNECOLOGY OFFICE COLPOSCOPY PROCEDURE NOTE  23 y.o. G0P0000 here for colposcopy for ASCUS with POSITIVE high risk HPV pap smear on 04/19/24.   PapHx: 12/16/20 ASCUS/HPV+ 03/01/22 ASCUS/HPV neg 04/05/23 LSIL 04/19/24 ASCUS/HPV+, neg HPV 16/18/45  Pregnancy test:  negative Gardasil:  Discussed. Discussed role for HPV in cervical dysplasia, need for surveillance.  Informed consent and review of risks, benefit and alternatives performed. Written consent given.   Speculum inserted into patient's vagina assuring full view of cervix and vaginal walls. 3 swabs of vinegar solution applied to the cervix and vaginal walls and colposcope was used to observe both the cervix and vaginal walls.   Colposcopy adequate? Yes Circumferential thin AWE with areas of thicker AWE with smooth borders from 12-6 o'clock; corresponding biopsies obtained. Predict CIN II-III  All specimens were labeled and sent to pathology.  Monsel's applied to biopsy sites for good hemostasis and speculum removed.  Pt tolerated well with minimal pain and bleeding.   Patient was given post procedure instructions.  Will follow up pathology and manage accordingly; patient will be contacted with results and recommendations.  Routine preventative health maintenance measures emphasized.  Kieth Carolin, MD Obstetrician & Gynecologist, [facility] for [employer], [facility]

## 2024-05-09 NOTE — Patient Instructions (Signed)
It is normal to have cramping and bleeding for the next 2-3 days. You should feel better every day.  Please call us if you have any severe pain, bleeding that soaks more than 1 pad in a hour, have fevers, or feel like you're going to pass out.  You can take tylenol '1000mg'$  every 8 hours and ibuprofen '800mg'$  every 8 hours as needed for pain. It is ok to take both at the same time.

## 2024-05-13 LAB — SURGICAL PATHOLOGY

## 2024-05-14 ENCOUNTER — Ambulatory Visit: Payer: Self-pay | Admitting: Obstetrics and Gynecology
